# Patient Record
Sex: Female | Born: 1935 | Race: White | Hispanic: No | Marital: Married | Smoking: Never smoker
Health system: Southern US, Community
[De-identification: ages and names within clinical notes are randomized; demographics above are authoritative.]

## PROBLEM LIST (undated history)

## (undated) DIAGNOSIS — I10 Essential (primary) hypertension: Secondary | ICD-10-CM

## (undated) DIAGNOSIS — K449 Diaphragmatic hernia without obstruction or gangrene: Secondary | ICD-10-CM

## (undated) DIAGNOSIS — I2510 Atherosclerotic heart disease of native coronary artery without angina pectoris: Secondary | ICD-10-CM

## (undated) DIAGNOSIS — I4891 Unspecified atrial fibrillation: Secondary | ICD-10-CM

## (undated) DIAGNOSIS — I639 Cerebral infarction, unspecified: Secondary | ICD-10-CM

## (undated) DIAGNOSIS — F0390 Unspecified dementia without behavioral disturbance: Secondary | ICD-10-CM

## (undated) HISTORY — PX: CAROTID ENDARTERECTOMY: SUR193

---

## 2011-02-02 ENCOUNTER — Encounter: Payer: Self-pay | Admitting: *Deleted

## 2011-02-02 ENCOUNTER — Emergency Department (HOSPITAL_COMMUNITY)
Admission: EM | Admit: 2011-02-02 | Discharge: 2011-02-02 | Discharge status: Against Medical Advice | Payer: Medicare Other | Attending: Emergency Medicine | Admitting: Emergency Medicine

## 2011-02-02 DIAGNOSIS — M549 Dorsalgia, unspecified: Secondary | ICD-10-CM | POA: Insufficient documentation

## 2011-02-02 HISTORY — DX: Atherosclerotic heart disease of native coronary artery without angina pectoris: I25.10

## 2011-02-02 HISTORY — DX: Unspecified atrial fibrillation: I48.91

## 2011-02-02 HISTORY — DX: Essential (primary) hypertension: I10

## 2011-02-02 HISTORY — DX: Cerebral infarction, unspecified: I63.9

## 2011-02-02 NOTE — ED Notes (Signed)
The pt has been having lower back pain or flank pain since yesterday.  No nor v

## 2013-03-06 ENCOUNTER — Other Ambulatory Visit: Payer: Self-pay | Admitting: Internal Medicine

## 2013-04-16 ENCOUNTER — Other Ambulatory Visit: Payer: Self-pay | Admitting: Internal Medicine

## 2013-06-17 ENCOUNTER — Other Ambulatory Visit: Payer: Self-pay | Admitting: Internal Medicine

## 2013-06-19 NOTE — Telephone Encounter (Signed)
[facility] is not listed PCP.

## 2013-06-19 NOTE — Telephone Encounter (Signed)
Is this a clinic patient? There is no PCP listed and no notes in the chart.

## 2013-06-23 ENCOUNTER — Other Ambulatory Visit: Payer: Self-pay | Admitting: Internal Medicine

## 2013-11-18 ENCOUNTER — Emergency Department (HOSPITAL_COMMUNITY): Payer: Medicare Other

## 2013-11-18 ENCOUNTER — Emergency Department (HOSPITAL_COMMUNITY)
Admission: EM | Admit: 2013-11-18 | Discharge: 2013-11-19 | Disposition: A | Discharge status: Home / Self Care | Payer: Medicare Other | Attending: Emergency Medicine | Admitting: Emergency Medicine

## 2013-11-18 ENCOUNTER — Encounter (HOSPITAL_COMMUNITY): Payer: Self-pay | Admitting: Emergency Medicine

## 2013-11-18 DIAGNOSIS — R0609 Other forms of dyspnea: Secondary | ICD-10-CM | POA: Insufficient documentation

## 2013-11-18 DIAGNOSIS — Z7902 Long term (current) use of antithrombotics/antiplatelets: Secondary | ICD-10-CM | POA: Insufficient documentation

## 2013-11-18 DIAGNOSIS — I4891 Unspecified atrial fibrillation: Secondary | ICD-10-CM | POA: Insufficient documentation

## 2013-11-18 DIAGNOSIS — I2510 Atherosclerotic heart disease of native coronary artery without angina pectoris: Secondary | ICD-10-CM | POA: Diagnosis not present

## 2013-11-18 DIAGNOSIS — R059 Cough, unspecified: Secondary | ICD-10-CM | POA: Insufficient documentation

## 2013-11-18 DIAGNOSIS — R0602 Shortness of breath: Secondary | ICD-10-CM | POA: Diagnosis not present

## 2013-11-18 DIAGNOSIS — F0280 Dementia in other diseases classified elsewhere without behavioral disturbance: Secondary | ICD-10-CM | POA: Diagnosis not present

## 2013-11-18 DIAGNOSIS — R05 Cough: Secondary | ICD-10-CM | POA: Insufficient documentation

## 2013-11-18 DIAGNOSIS — G309 Alzheimer's disease, unspecified: Secondary | ICD-10-CM | POA: Insufficient documentation

## 2013-11-18 DIAGNOSIS — I10 Essential (primary) hypertension: Secondary | ICD-10-CM | POA: Diagnosis not present

## 2013-11-18 DIAGNOSIS — Z8673 Personal history of transient ischemic attack (TIA), and cerebral infarction without residual deficits: Secondary | ICD-10-CM | POA: Diagnosis not present

## 2013-11-18 DIAGNOSIS — R0989 Other specified symptoms and signs involving the circulatory and respiratory systems: Secondary | ICD-10-CM | POA: Insufficient documentation

## 2013-11-18 DIAGNOSIS — R0600 Dyspnea, unspecified: Secondary | ICD-10-CM

## 2013-11-18 DIAGNOSIS — Z79899 Other long term (current) drug therapy: Secondary | ICD-10-CM | POA: Diagnosis not present

## 2013-11-18 LAB — CBC WITH DIFFERENTIAL/PLATELET
BASOS ABS: 0.1 10*3/uL (ref 0.0–0.1)
BASOS PCT: 0 % (ref 0–1)
EOS PCT: 2 % (ref 0–5)
Eosinophils Absolute: 0.3 10*3/uL (ref 0.0–0.7)
HCT: 38.4 % (ref 36.0–46.0)
Hemoglobin: 12.9 g/dL (ref 12.0–15.0)
LYMPHS PCT: 21 % (ref 12–46)
Lymphs Abs: 3 10*3/uL (ref 0.7–4.0)
MCH: 31 pg (ref 26.0–34.0)
MCHC: 33.6 g/dL (ref 30.0–36.0)
MCV: 92.3 fL (ref 78.0–100.0)
MONO ABS: 1.2 10*3/uL — AB (ref 0.1–1.0)
Monocytes Relative: 8 % (ref 3–12)
NEUTROS ABS: 10 10*3/uL — AB (ref 1.7–7.7)
Neutrophils Relative %: 69 % (ref 43–77)
PLATELETS: 245 10*3/uL (ref 150–400)
RBC: 4.16 MIL/uL (ref 3.87–5.11)
RDW: 14.4 % (ref 11.5–15.5)
WBC: 14.6 10*3/uL — AB (ref 4.0–10.5)

## 2013-11-18 LAB — BASIC METABOLIC PANEL
ANION GAP: 14 (ref 5–15)
BUN: 35 mg/dL — ABNORMAL HIGH (ref 6–23)
CALCIUM: 9.1 mg/dL (ref 8.4–10.5)
CO2: 24 meq/L (ref 19–32)
CREATININE: 1.34 mg/dL — AB (ref 0.50–1.10)
Chloride: 98 mEq/L (ref 96–112)
GFR calc non Af Amer: 35 mL/min — ABNORMAL LOW (ref >90)
GFR, EST AFRICAN AMERICAN: 41 mL/min — AB (ref >90)
Glucose, Bld: 105 mg/dL — ABNORMAL HIGH (ref 70–99)
Potassium: 3.7 mEq/L (ref 3.7–5.3)
SODIUM: 136 meq/L — AB (ref 137–147)

## 2013-11-18 LAB — I-STAT TROPONIN, ED: Troponin i, poc: 0.01 ng/mL (ref 0.00–0.08)

## 2013-11-18 LAB — PRO B NATRIURETIC PEPTIDE: PRO B NATRI PEPTIDE: 1073 pg/mL — AB (ref 0–450)

## 2013-11-18 MED ORDER — IPRATROPIUM-ALBUTEROL 0.5-2.5 (3) MG/3ML IN SOLN
3.0000 mL | Freq: Once | RESPIRATORY_TRACT | Status: AC
  Administered 2013-11-18: 3 mL via RESPIRATORY_TRACT
  Filled 2013-11-18: qty 3

## 2013-11-18 NOTE — ED Notes (Addendum)
The patient's daughter said her mother has not been feeling well for a couple of days.  I asked the patient if her throat is sore asnd she said no.  The daughter advised me her mother has alzheimers and doesn't remember tellling her.  The patient has been afebrile, the cough is non-productive, and the halls the daughter gave her helped her throat. The patient denies any other symptoms.   The daughter says she has has dyspnea with exertion and this is not normal for her but she thinks it is due to her sinus congestion.

## 2013-11-18 NOTE — Discharge Instructions (Signed)
Cough, Adult  A cough is a reflex that helps clear your throat and airways. It can help heal the body or may be a reaction to an irritated airway. A cough may only last 2 or 3 weeks (acute) or may last more than 8 weeks (chronic).  CAUSES Acute cough:  Viral or bacterial infections. Chronic cough:  Infections.  Allergies.  Asthma.  Post-nasal drip.  Smoking.  Heartburn or acid reflux.  Some medicines.  Chronic lung problems (COPD).  Cancer. SYMPTOMS   Cough.  Fever.  Chest pain.  Increased breathing rate.  High-pitched whistling sound when breathing (wheezing).  Colored mucus that you cough up (sputum). TREATMENT   A bacterial cough may be treated with antibiotic medicine.  A viral cough must run its course and will not respond to antibiotics.  Your caregiver may recommend other treatments if you have a chronic cough. HOME CARE INSTRUCTIONS   Only take over-the-counter or prescription medicines for pain, discomfort, or fever as directed by your caregiver. Use cough suppressants only as directed by your caregiver.  Use a cold steam vaporizer or humidifier in your bedroom or home to help loosen secretions.  Sleep in a semi-upright position if your cough is worse at night.  Rest as needed.  Stop smoking if you smoke. SEEK IMMEDIATE MEDICAL CARE IF:   You have pus in your sputum.  Your cough starts to worsen.  You cannot control your cough with suppressants and are losing sleep.  You begin coughing up blood.  You have difficulty breathing.  You develop pain which is getting worse or is uncontrolled with medicine.  You have a fever. MAKE SURE YOU:   Understand these instructions.  Will watch your condition.  Will get help right away if you are not doing well or get worse. Document Released: 08/22/2010 Document Revised: 05/18/2011 Document Reviewed: 08/22/2010 [employer] Patient Information 2015 [employer]. This information is not intended  to replace advice given to you by your health care provider. Make sure you discuss any questions you have with your health care provider.  

## 2013-11-18 NOTE — ED Provider Notes (Signed)
CSN: [id number]     Arrival date & time 11/18/13  1927 History   First MD Initiated Contact with Patient 11/18/13 2130     Chief Complaint  Patient presents with  . Cough    The patient's daughter said her mother has not been feeling well for a couple of days.  I asked the patient if her throat is sore asnd she said no.  The daughter advised me her mother has alzheimers and doesn't remember tellling her.  . Sore Throat     (Consider location/radiation/quality/duration/timing/severity/associated sxs/prior Treatment) Patient is a 80 y.o. female presenting with cough.  Cough Cough characteristics:  Non-productive Severity:  Moderate Onset quality:  Gradual Duration:  2 days Timing:  Constant Progression:  Unchanged Chronicity:  New Context: not sick contacts and not upper respiratory infection   Relieved by:  Nothing Worsened by:  Nothing tried Ineffective treatments:  None tried Associated symptoms: shortness of breath   Associated symptoms: no chest pain, no chills, no fever, no rash, no rhinorrhea and no sore throat     Past Medical History  Diagnosis Date  . Stroke   . Coronary artery disease   . Atrial fibrillation   . Hypertension    History reviewed. No pertinent past surgical history. History reviewed. No pertinent family history. History  Substance Use Topics  . Smoking status: Never Smoker   . Smokeless tobacco: Never Used  . Alcohol Use: No   OB History   Grav Para Term Preterm Abortions TAB SAB Ect Mult Living                 Review of Systems  Constitutional: Negative for fever and chills.  HENT: Negative for congestion, rhinorrhea and sore throat.   Eyes: Negative for photophobia and visual disturbance.  Respiratory: Positive for cough and shortness of breath.   Cardiovascular: Negative for chest pain and leg swelling.  Gastrointestinal: Negative for nausea, vomiting, abdominal pain, diarrhea and constipation.  Endocrine: Negative for polyphagia and  polyuria.  Genitourinary: Negative for dysuria, flank pain, vaginal bleeding, vaginal discharge and enuresis.  Musculoskeletal: Negative for back pain and gait problem.  Skin: Negative for color change and rash.  Neurological: Negative for dizziness, syncope, light-headedness and numbness.  Hematological: Negative for adenopathy. Does not bruise/bleed easily.  All other systems reviewed and are negative.     Allergies  Review of patient's allergies indicates no known allergies.  Home Medications   Prior to Admission medications   Medication Sig Start Date End Date Taking? Authorizing Provider  dabigatran (PRADAXA) 75 MG CAPS Take 75 mg by mouth every 12 (twelve) hours.      Historical Provider, MD  niacin 100 MG tablet Take 100 mg by mouth daily with breakfast.      Historical Provider, MD  simvastatin (ZOCOR) 10 MG tablet Take 10 mg by mouth at bedtime.      Historical Provider, MD  telmisartan (MICARDIS) 40 MG tablet Take 40 mg by mouth daily.      Historical Provider, MD  verapamil (CALAN-SR) 120 MG CR tablet Take 240 mg by mouth at bedtime.      Historical Provider, MD   BP 140/56  Pulse 84  Temp(Src) 97.5 F (36.4 C) (Oral)  Resp 17  SpO2 94% Physical Exam  Vitals reviewed. Constitutional: She is oriented to person, place, and time. She appears well-developed and well-nourished.  HENT:  Head: Normocephalic and atraumatic.  Right Ear: External ear normal.  Left Ear: External ear normal.  Mouth/Throat: Oropharynx is clear and moist. No posterior oropharyngeal edema or posterior oropharyngeal erythema.  Eyes: Conjunctivae and EOM are normal. Pupils are equal, round, and reactive to light.  Neck: Normal range of motion. Neck supple.  Cardiovascular: Normal rate, regular rhythm, normal heart sounds and intact distal pulses.   Pulmonary/Chest: Effort normal and breath sounds normal.  Abdominal: Soft. Bowel sounds are normal. There is no tenderness.  Musculoskeletal: Normal  range of motion.  Neurological: She is alert and oriented to person, place, and time.  Skin: Skin is warm and dry.    ED Course  Procedures (including critical care time) Labs Review Labs Reviewed  CBC WITH DIFFERENTIAL - Abnormal; Notable for the following:    WBC 14.6 (*)    Neutro Abs 10.0 (*)    Monocytes Absolute 1.2 (*)    All other components within normal limits  BASIC METABOLIC PANEL - Abnormal; Notable for the following:    Sodium 136 (*)    Glucose, Bld 105 (*)    BUN 35 (*)    Creatinine, Ser 1.34 (*)    GFR calc non Af Amer 35 (*)    GFR calc Af Amer 41 (*)    All other components within normal limits  PRO B NATRIURETIC PEPTIDE - Abnormal; Notable for the following:    Pro B Natriuretic peptide (BNP) 1073.0 (*)    All other components within normal limits  I-STAT TROPOININ, ED    Imaging Review Dg Chest 2 View (if Patient Has Fever And/or Copd)  11/18/2013   CLINICAL DATA:  Sore throat, cough and shortness of Breath.  EXAM: CHEST  2 VIEW  COMPARISON:  09/12/2013.  FINDINGS: The heart is enlarged but stable. There is tortuosity and calcification of the thoracic aorta. There is a large hiatal hernia with compressive atelectasis of the left lower lobe. No infiltrates or effusions. No definite pleural effusion. The bony thorax is intact and stable.  IMPRESSION: Large hiatal hernia, unchanged.  No acute pulmonary findings.   Electronically Signed   By: Loralie Champagne M.D.   On: 11/18/2013 20:44     EKG Interpretation   Date/Time:  Saturday November 18 2013 21:39:14 EDT Ventricular Rate:  80 PR Interval:    QRS Duration: 83 QT Interval:  430 QTC Calculation: 496 R Axis:   80 Text Interpretation:  Atrial fibrillation Borderline prolonged QT interval  No old tracing to compare Confirmed by Mirian Mo [phone]) on  11/18/2013 9:46:02 PM      MDM   Final diagnoses:  Cough  Dyspnea    80 y.o. female  with pertinent PMH of alzheimers, CAD, afib, CVA  presents with cough and sore throat x2 days. No fevers, GI symptoms. The patient has had a nonproductive cough and complains of a sore throat. On arrival to ED vital signs and physical exam as above, patient currently not complaining of any symptoms. Physical exam demonstrated clear oropharynx, EKG as above.    Labs and imaging as above reviewed. CXR unremarkable for acute pathology.  Trop negative.  Likely etiology benign dyspnea and cough.  Discussed mildly elevated BNP and pt is to fu with cardiology re: same, however do not suspect ACS or emergent cardiac pathology, as pt had no symptoms and only 1 time cough during stay.  DC home in stable condition.  1. Cough   2. Dyspnea         Mirian Mo, MD 11/18/13 2358

## 2013-11-18 NOTE — ED Notes (Signed)
Dr. Gentry at bedside. 

## 2014-04-15 ENCOUNTER — Inpatient Hospital Stay (HOSPITAL_COMMUNITY)
Admission: EM | Admit: 2014-04-15 | Discharge: 2014-04-18 | DRG: 683 | Payer: Medicare Other | Attending: Internal Medicine | Admitting: Internal Medicine

## 2014-04-15 ENCOUNTER — Inpatient Hospital Stay (HOSPITAL_COMMUNITY): Payer: Medicare Other

## 2014-04-15 ENCOUNTER — Encounter (HOSPITAL_COMMUNITY): Payer: Self-pay | Admitting: Internal Medicine

## 2014-04-15 ENCOUNTER — Emergency Department (HOSPITAL_COMMUNITY): Payer: Medicare Other

## 2014-04-15 DIAGNOSIS — I129 Hypertensive chronic kidney disease with stage 1 through stage 4 chronic kidney disease, or unspecified chronic kidney disease: Secondary | ICD-10-CM | POA: Diagnosis present

## 2014-04-15 DIAGNOSIS — Z7901 Long term (current) use of anticoagulants: Secondary | ICD-10-CM | POA: Diagnosis not present

## 2014-04-15 DIAGNOSIS — K5900 Constipation, unspecified: Secondary | ICD-10-CM | POA: Diagnosis present

## 2014-04-15 DIAGNOSIS — G309 Alzheimer's disease, unspecified: Secondary | ICD-10-CM | POA: Diagnosis present

## 2014-04-15 DIAGNOSIS — N183 Chronic kidney disease, stage 3 unspecified: Secondary | ICD-10-CM | POA: Diagnosis present

## 2014-04-15 DIAGNOSIS — R339 Retention of urine, unspecified: Secondary | ICD-10-CM | POA: Diagnosis present

## 2014-04-15 DIAGNOSIS — F0280 Dementia in other diseases classified elsewhere without behavioral disturbance: Secondary | ICD-10-CM | POA: Diagnosis present

## 2014-04-15 DIAGNOSIS — I2510 Atherosclerotic heart disease of native coronary artery without angina pectoris: Secondary | ICD-10-CM | POA: Diagnosis present

## 2014-04-15 DIAGNOSIS — Z9981 Dependence on supplemental oxygen: Secondary | ICD-10-CM

## 2014-04-15 DIAGNOSIS — N179 Acute kidney failure, unspecified: Secondary | ICD-10-CM | POA: Diagnosis present

## 2014-04-15 DIAGNOSIS — I4891 Unspecified atrial fibrillation: Secondary | ICD-10-CM | POA: Diagnosis present

## 2014-04-15 DIAGNOSIS — Z8673 Personal history of transient ischemic attack (TIA), and cerebral infarction without residual deficits: Secondary | ICD-10-CM | POA: Diagnosis not present

## 2014-04-15 DIAGNOSIS — R414 Neurologic neglect syndrome: Secondary | ICD-10-CM | POA: Diagnosis present

## 2014-04-15 DIAGNOSIS — J189 Pneumonia, unspecified organism: Secondary | ICD-10-CM | POA: Diagnosis present

## 2014-04-15 DIAGNOSIS — F0390 Unspecified dementia without behavioral disturbance: Secondary | ICD-10-CM | POA: Diagnosis present

## 2014-04-15 LAB — BASIC METABOLIC PANEL
Anion gap: 10 (ref 5–15)
BUN: 34 mg/dL — ABNORMAL HIGH (ref 6–23)
CO2: 28 mmol/L (ref 19–32)
Calcium: 9.1 mg/dL (ref 8.4–10.5)
Chloride: 104 mmol/L (ref 96–112)
Creatinine, Ser: 1.75 mg/dL — ABNORMAL HIGH (ref 0.50–1.10)
GFR calc Af Amer: 29 mL/min — ABNORMAL LOW (ref >90)
GFR calc non Af Amer: 25 mL/min — ABNORMAL LOW (ref >90)
Glucose, Bld: 148 mg/dL — ABNORMAL HIGH (ref 70–99)
Potassium: 4.6 mmol/L (ref 3.5–5.1)
Sodium: 142 mmol/L (ref 135–145)

## 2014-04-15 LAB — CBC WITH DIFFERENTIAL/PLATELET
BASOS ABS: 0.1 10*3/uL (ref 0.0–0.1)
Basophils Relative: 1 % (ref 0–1)
EOS ABS: 0.5 10*3/uL (ref 0.0–0.7)
EOS PCT: 4 % (ref 0–5)
HEMATOCRIT: 40.9 % (ref 36.0–46.0)
Hemoglobin: 13 g/dL (ref 12.0–15.0)
LYMPHS ABS: 2.5 10*3/uL (ref 0.7–4.0)
Lymphocytes Relative: 17 % (ref 12–46)
MCH: 31.2 pg (ref 26.0–34.0)
MCHC: 31.8 g/dL (ref 30.0–36.0)
MCV: 98.1 fL (ref 78.0–100.0)
Monocytes Absolute: 1 10*3/uL (ref 0.1–1.0)
Monocytes Relative: 7 % (ref 3–12)
NEUTROS PCT: 71 % (ref 43–77)
Neutro Abs: 10.4 10*3/uL — ABNORMAL HIGH (ref 1.7–7.7)
PLATELETS: 207 10*3/uL (ref 150–400)
RBC: 4.17 MIL/uL (ref 3.87–5.11)
RDW: 14.6 % (ref 11.5–15.5)
WBC: 14.5 10*3/uL — AB (ref 4.0–10.5)

## 2014-04-15 LAB — URINALYSIS, ROUTINE W REFLEX MICROSCOPIC
Bilirubin Urine: NEGATIVE
GLUCOSE, UA: NEGATIVE mg/dL
Hgb urine dipstick: NEGATIVE
Ketones, ur: NEGATIVE mg/dL
Leukocytes, UA: NEGATIVE
NITRITE: NEGATIVE
Protein, ur: NEGATIVE mg/dL
SPECIFIC GRAVITY, URINE: 1.014 (ref 1.005–1.030)
Urobilinogen, UA: 0.2 mg/dL (ref 0.0–1.0)
pH: 7.5 (ref 5.0–8.0)

## 2014-04-15 MED ORDER — SIMVASTATIN 10 MG PO TABS
10.0000 mg | ORAL_TABLET | Freq: Every day | ORAL | Status: DC
  Administered 2014-04-15 – 2014-04-16 (×2): 10 mg via ORAL
  Filled 2014-04-15 (×4): qty 1

## 2014-04-15 MED ORDER — METOPROLOL SUCCINATE ER 25 MG PO TB24
25.0000 mg | ORAL_TABLET | Freq: Every day | ORAL | Status: DC
  Administered 2014-04-16 – 2014-04-18 (×3): 25 mg via ORAL
  Filled 2014-04-15 (×3): qty 1

## 2014-04-15 MED ORDER — ENOXAPARIN SODIUM 30 MG/0.3ML ~~LOC~~ SOLN: 30.0000 mg | SUBCUTANEOUS | Status: DC

## 2014-04-15 MED ORDER — PNEUMOCOCCAL VAC POLYVALENT 25 MCG/0.5ML IJ INJ
0.5000 mL | INJECTION | INTRAMUSCULAR | Status: AC
  Administered 2014-04-16: 0.5 mL via INTRAMUSCULAR
  Filled 2014-04-15 (×2): qty 0.5

## 2014-04-15 MED ORDER — SENNOSIDES-DOCUSATE SODIUM 8.6-50 MG PO TABS
1.0000 | ORAL_TABLET | Freq: Every day | ORAL | Status: DC
  Administered 2014-04-15 – 2014-04-16 (×2): 1 via ORAL
  Filled 2014-04-15 (×2): qty 1

## 2014-04-15 MED ORDER — POLYETHYLENE GLYCOL 3350 17 G PO PACK
17.0000 g | PACK | Freq: Two times a day (BID) | ORAL | Status: DC
  Administered 2014-04-15 – 2014-04-18 (×3): 17 g via ORAL
  Filled 2014-04-15 (×6): qty 1

## 2014-04-15 MED ORDER — DEXTROSE 5 % IV SOLN: 1.0000 g | INTRAVENOUS | Status: DC

## 2014-04-15 MED ORDER — FLEET ENEMA 7-19 GM/118ML RE ENEM: 1.0000 | ENEMA | Freq: Every day | RECTAL | Status: DC | PRN

## 2014-04-15 MED ORDER — CEFTRIAXONE SODIUM IN DEXTROSE 20 MG/ML IV SOLN
1.0000 g | INTRAVENOUS | Status: DC
  Administered 2014-04-16 – 2014-04-17 (×2): 1 g via INTRAVENOUS
  Filled 2014-04-15 (×3): qty 50

## 2014-04-15 MED ORDER — DOCUSATE SODIUM 100 MG PO CAPS
100.0000 mg | ORAL_CAPSULE | Freq: Two times a day (BID) | ORAL | Status: DC
  Administered 2014-04-15 – 2014-04-18 (×4): 100 mg via ORAL
  Filled 2014-04-15 (×7): qty 1

## 2014-04-15 MED ORDER — NIACIN 100 MG PO TABS
100.0000 mg | ORAL_TABLET | Freq: Every day | ORAL | Status: DC
  Administered 2014-04-16 – 2014-04-18 (×3): 100 mg via ORAL
  Filled 2014-04-15 (×3): qty 1

## 2014-04-15 MED ORDER — DABIGATRAN ETEXILATE MESYLATE 75 MG PO CAPS
75.0000 mg | ORAL_CAPSULE | Freq: Two times a day (BID) | ORAL | Status: DC
  Administered 2014-04-16 – 2014-04-18 (×4): 75 mg via ORAL
  Filled 2014-04-15 (×6): qty 1

## 2014-04-15 MED ORDER — SODIUM CHLORIDE 0.9 % IV SOLN
INTRAVENOUS | Status: AC
  Administered 2014-04-15: 21:00:00 via INTRAVENOUS

## 2014-04-15 MED ORDER — FLEET ENEMA 7-19 GM/118ML RE ENEM
1.0000 | ENEMA | Freq: Once | RECTAL | Status: AC
  Administered 2014-04-15: 1 via RECTAL
  Filled 2014-04-15: qty 1

## 2014-04-15 MED ORDER — DARIFENACIN HYDROBROMIDE ER 15 MG PO TB24
15.0000 mg | ORAL_TABLET | Freq: Every day | ORAL | Status: DC
  Administered 2014-04-16 – 2014-04-18 (×3): 15 mg via ORAL
  Filled 2014-04-15 (×3): qty 1

## 2014-04-15 MED ORDER — DEXTROSE 5 % IV SOLN
500.0000 mg | Freq: Every day | INTRAVENOUS | Status: DC
  Administered 2014-04-16 – 2014-04-17 (×2): 500 mg via INTRAVENOUS
  Filled 2014-04-15 (×3): qty 500

## 2014-04-15 MED ORDER — DEXTROSE 5 % IV SOLN
1.0000 g | Freq: Once | INTRAVENOUS | Status: AC
  Administered 2014-04-15: 1 g via INTRAVENOUS
  Filled 2014-04-15: qty 10

## 2014-04-15 MED ORDER — DEXTROSE 5 % IV SOLN: 500.0000 mg | INTRAVENOUS | Status: DC

## 2014-04-15 MED ORDER — DEXTROSE 5 % IV SOLN
500.0000 mg | Freq: Once | INTRAVENOUS | Status: AC
  Administered 2014-04-15: 500 mg via INTRAVENOUS
  Filled 2014-04-15: qty 500

## 2014-04-15 MED ORDER — MEMANTINE HCL ER 28 MG PO CP24
28.0000 mg | ORAL_CAPSULE | Freq: Every day | ORAL | Status: DC
  Administered 2014-04-16 – 2014-04-18 (×3): 28 mg via ORAL
  Filled 2014-04-15 (×3): qty 1

## 2014-04-15 MED ORDER — BISACODYL 10 MG RE SUPP: 10.0000 mg | Freq: Every day | RECTAL | Status: DC | PRN

## 2014-04-15 NOTE — ED Notes (Signed)
Pt family reports pt has not had bowel movement in 2 weeks. Went to urgent care last Monday was given a laxative, went to ED on Wednesday was given another laxative and dx with UTI and started on abx. Went to pcp on Friday and was told to give fleets enema and other enema. Pt still has not had bowel movement. Pt told to come to ED. Pain 8/10. Pt has not eaten in 2 days.

## 2014-04-15 NOTE — ED Notes (Signed)
Bladder scan showed 600 ml in bladder

## 2014-04-15 NOTE — ED Notes (Signed)
MD at bedside. 

## 2014-04-15 NOTE — ED Provider Notes (Signed)
CSN: [id number]     Arrival date & time 04/15/14  1606 History   First MD Initiated Contact with Patient 04/15/14 1713     Chief Complaint  Patient presents with  . Constipation     (Consider location/radiation/quality/duration/timing/severity/associated sxs/prior Treatment) HPI Comments: Patient with past medical history remarkable for Alzheimer's, stroke, CAD, A. fib, and hypertension presents to the emergency department with chief complaint of constipation. Patient's daughter states patient has not had a bowel movement in 2 weeks. She has tried laxatives, and fleets enemas. Daughter also reports urinary hesitancy. She states that she was recently diagnosed with a UTI.  Level V caveat applies secondary to dementia.  Daughter also reports that the patient has felt short of breath and has had a cough.  Lives at home.  No recent hospitalizations.  The history is provided by the patient. No language interpreter was used.    Past Medical History  Diagnosis Date  . Stroke   . Coronary artery disease   . Atrial fibrillation   . Hypertension    No past surgical history on file. No family history on file. History  Substance Use Topics  . Smoking status: Never Smoker   . Smokeless tobacco: Never Used  . Alcohol Use: No   OB History    No data available     Review of Systems  Constitutional: Negative for fever and chills.  Respiratory: Negative for shortness of breath.   Cardiovascular: Negative for chest pain.  Gastrointestinal: Positive for constipation. Negative for nausea, vomiting and diarrhea.  Genitourinary: Positive for difficulty urinating. Negative for dysuria.  All other systems reviewed and are negative.     Allergies  Review of patient's allergies indicates no known allergies.  Home Medications   Prior to Admission medications   Medication Sig Start Date End Date Taking? Authorizing Provider  dabigatran (PRADAXA) 75 MG CAPS Take 75 mg by mouth every 12  (twelve) hours.      Historical Provider, MD  niacin 100 MG tablet Take 100 mg by mouth daily with breakfast.      Historical Provider, MD  simvastatin (ZOCOR) 10 MG tablet Take 10 mg by mouth at bedtime.      Historical Provider, MD  telmisartan (MICARDIS) 40 MG tablet Take 40 mg by mouth daily.      Historical Provider, MD  verapamil (CALAN-SR) 120 MG CR tablet Take 240 mg by mouth at bedtime.      Historical Provider, MD   BP 127/79 mmHg  Pulse 88  Temp(Src) 98.1 F (36.7 C) (Oral)  Resp 20  SpO2 95% Physical Exam  Constitutional: She is oriented to person, place, and time. She appears well-developed and well-nourished.  HENT:  Head: Normocephalic and atraumatic.  Eyes: Conjunctivae and EOM are normal. Pupils are equal, round, and reactive to light.  Neck: Normal range of motion. Neck supple.  Cardiovascular: Normal rate and regular rhythm.  Exam reveals no gallop and no friction rub.   No murmur heard. Pulmonary/Chest: Effort normal and breath sounds normal. No respiratory distress. She has no wheezes. She has no rales. She exhibits no tenderness.  Abdominal: Soft. Bowel sounds are normal. She exhibits distension. She exhibits no mass. There is no tenderness. There is no rebound and no guarding.  Abdomen moderately distended, but no tenderness to palpation  Musculoskeletal: Normal range of motion. She exhibits no edema or tenderness.  Neurological: She is alert and oriented to person, place, and time.  Skin: Skin is warm and dry.  Psychiatric: She has a normal mood and affect. Her behavior is normal. Judgment and thought content normal.  Nursing note and vitals reviewed.   ED Course  Fecal disimpaction Date/Time: 04/15/2014 7:23 PM Performed by: Roxy HorsemanBROWNING, Chaynce Schafer Authorized by: Roxy HorsemanBROWNING, Leonid Manus Consent: Verbal consent obtained. Risks and benefits: risks, benefits and alternatives were discussed Consent given by: patient Patient understanding: patient states understanding of the  procedure being performed Patient consent: the patient's understanding of the procedure matches consent given Procedure consent: procedure consent matches procedure scheduled Relevant documents: relevant documents present and verified Test results: test results available and properly labeled Site marked: the operative site was marked Imaging studies: imaging studies available Required items: required blood products, implants, devices, and special equipment available Patient identity confirmed: verbally with patient Preparation: Patient was prepped and draped in the usual sterile fashion. Local anesthesia used: no Patient sedated: no Patient tolerance: Patient tolerated the procedure well with no immediate complications Comments: Large amount of stool removed by me, however, large very firm stool ball remains out of reach   Results for orders placed or performed during the hospital encounter of 04/15/14  Urinalysis, Routine w reflex microscopic  Result Value Ref Range   Color, Urine YELLOW YELLOW   APPearance CLEAR CLEAR   Specific Gravity, Urine 1.014 1.005 - 1.030   pH 7.5 5.0 - 8.0   Glucose, UA NEGATIVE NEGATIVE mg/dL   Hgb urine dipstick NEGATIVE NEGATIVE   Bilirubin Urine NEGATIVE NEGATIVE   Ketones, ur NEGATIVE NEGATIVE mg/dL   Protein, ur NEGATIVE NEGATIVE mg/dL   Urobilinogen, UA 0.2 0.0 - 1.0 mg/dL   Nitrite NEGATIVE NEGATIVE   Leukocytes, UA NEGATIVE NEGATIVE  CBC with Differential/Platelet  Result Value Ref Range   WBC 14.5 (H) 4.0 - 10.5 K/uL   RBC 4.17 3.87 - 5.11 MIL/uL   Hemoglobin 13.0 12.0 - 15.0 g/dL   HCT 09.840.9 11.936.0 - 14.746.0 %   MCV 98.1 78.0 - 100.0 fL   MCH 31.2 26.0 - 34.0 pg   MCHC 31.8 30.0 - 36.0 g/dL   RDW 82.914.6 56.211.5 - 13.015.5 %   Platelets 207 150 - 400 K/uL   Neutrophils Relative % 71 43 - 77 %   Neutro Abs 10.4 (H) 1.7 - 7.7 K/uL   Lymphocytes Relative 17 12 - 46 %   Lymphs Abs 2.5 0.7 - 4.0 K/uL   Monocytes Relative 7 3 - 12 %   Monocytes Absolute  1.0 0.1 - 1.0 K/uL   Eosinophils Relative 4 0 - 5 %   Eosinophils Absolute 0.5 0.0 - 0.7 K/uL   Basophils Relative 1 0 - 1 %   Basophils Absolute 0.1 0.0 - 0.1 K/uL  Basic metabolic panel  Result Value Ref Range   Sodium 142 135 - 145 mmol/L   Potassium 4.6 3.5 - 5.1 mmol/L   Chloride 104 96 - 112 mmol/L   CO2 28 19 - 32 mmol/L   Glucose, Bld 148 (H) 70 - 99 mg/dL   BUN 34 (H) 6 - 23 mg/dL   Creatinine, Ser 8.651.75 (H) 0.50 - 1.10 mg/dL   Calcium 9.1 8.4 - 78.410.5 mg/dL   GFR calc non Af Amer 25 (L) >90 mL/min   GFR calc Af Amer 29 (L) >90 mL/min   Anion gap 10 5 - 15   Dg Abd Acute W/chest  04/15/2014   CLINICAL DATA:  Abdominal distention.  No bowel movement in 2 weeks.  EXAM: ACUTE ABDOMEN SERIES (ABDOMEN 2 VIEW & CHEST 1  VIEW)  COMPARISON:  04/03/2014  FINDINGS: Cardiac enlargement. Pulmonary vascularity appears normal. Scattered fibrosis in the lungs. Increased density in the right upper lung suggests infiltration or atelectasis. Pneumonia should be excluded. No blunting of costophrenic angles. No pneumothorax. Calcified and tortuous aorta. Surgical clips in the right axilla. Large esophageal hiatal hernia behind the heart.  Diffusely stool-filled colon with scattered gas in the colon and small bowel. No small or large bowel distention. No free intra-abdominal air. No abnormal air-fluid levels. No radiopaque stones identified. Vascular calcifications. Degenerative changes in the spine and hips.  IMPRESSION: Cardiac enlargement without vascular congestion. Infiltration or atelectasis in the right upper lung possibly due to pneumonia. Diffusely stool-filled colon consistent with clinical history of constipation. No evidence of bowel obstruction or free air.   Electronically Signed   By: Burman Nieves M.D.   On: 04/15/2014 18:03     Imaging Review No results found.   EKG Interpretation None      MDM   Final diagnoses:  Constipation, unspecified constipation type  Urinary retention   AKI (acute kidney injury)  CAP (community acquired pneumonia)    Patient with reported constipation 2 weeks. No notable bowel movements. Has tried laxatives and enemas with no relief. Also recently diagnosed with a UTI.  Patient retaining of urine, likely 2/2 stool burden.  Some associated AKI.  Cr 1.75.  CXR remarkable for possible pneumonia; this is consistent with patient's cough and SOB.  7:25 PM Large amount of stool removed by me, however, a large very firm stool ball remains out of reach.  Patient discussed with Dr. Donnald Garre who recommends admission.  Will consult [facility].  Appreciate Dr. Adela Glimpse for admission.    Roxy Horseman, PA-C 04/15/14 2010  Arby Barrette, MD 04/17/14 [phone]

## 2014-04-15 NOTE — H&P (Signed)
PCP: Zoila Shutter, MD    Chief Complaint:  No BM for 2 weeks   HPI: Carolyn Sanders is a 81 y.o. female   has a past medical history of Stroke; Coronary artery disease; Atrial fibrillation; and Hypertension.   Presented with  Family states patient have not had a BM 2 weeks. She was seen at Urgent care 1 week ago and was treated for UTI. She was told that she is constipated and was treated with miralax and fleet enema with no results. She has been short of breath and have had some cough. She is on home oxygen 2 L at night. In ER WBC 14.5. CXR showing CA and large amount of stool patient was disimpacted. Per family she have not had good urine output for the past 4  Days. A foley was placed and urine was obtained. 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  Hospitalist was called for admission for obstipation, CP, urinary retention.   Review of Systems:    Pertinent positives include fatigue, non-productive cough  Constitutional:  No weight loss, night sweats, Fevers, chills,  weight loss  HEENT:  No headaches, Difficulty swallowing,Tooth/dental problems,Sore throat,  No sneezing, itching, ear ache, nasal congestion, post nasal drip,  Cardio-vascular:  No chest pain, Orthopnea, PND, anasarca, dizziness, palpitations.no Bilateral lower extremity swelling  GI:  No heartburn, indigestion, abdominal pain, nausea, vomiting, diarrhea, change in bowel habits, loss of appetite, melena, blood in stool, hematemesis Resp:  no shortness of breath at rest. No dyspnea on exertion, No excess mucus, no productive cough, No , No coughing up of blood.No change in color of mucus.No wheezing. Skin:  no rash or lesions. No jaundice GU:  no dysuria, change in color of urine, no urgency or frequency. No straining  to urinate.  No flank pain.  Musculoskeletal:  No joint pain or no joint swelling. No decreased range of motion. No back pain.  Psych:  No change in mood or affect. No depression or anxiety. No memory loss.  Neuro: no localizing neurological complaints, no tingling, no weakness, no double vision, no gait abnormality, no slurred speech, no confusion  Otherwise ROS are negative except for above, 10 systems were reviewed  Past Medical History: Past Medical History  Diagnosis Date  . Stroke   . Coronary artery disease   . Atrial fibrillation   . Hypertension    History reviewed. No pertinent past surgical history.   Medications: Prior to Admission medications   Medication Sig Start Date End Date Taking? Authorizing Provider  dabigatran (PRADAXA) 75 MG CAPS Take 75 mg by mouth every 12 (twelve) hours.     Yes Historical Provider, MD  hydrochlorothiazide (HYDRODIURIL) 25 MG tablet Take 25 mg by mouth daily.   Yes Historical Provider, MD  memantine (NAMENDA XR) 28 MG CP24 24 hr capsule Take 28 mg by mouth daily.   Yes Historical Provider, MD  metoprolol succinate (TOPROL-XL) 25 MG 24 hr tablet Take 25 mg by mouth daily.   Yes Historical Provider, MD  niacin 100 MG tablet Take 100 mg by mouth daily with breakfast.     Yes Historical Provider, MD  simvastatin (ZOCOR) 10 MG tablet Take 10 mg by mouth at bedtime.     Yes Historical Provider, MD  solifenacin (VESICARE)  10 MG tablet Take 10 mg by mouth daily.   Yes Historical Provider, MD  telmisartan (MICARDIS) 40 MG tablet Take 40 mg by mouth daily.     Yes Historical Provider, MD    Allergies:  No Known Allergies  Social History:  Ambulatory   independently supposed to use a walker but does not Lives at home  With family     reports that she has never smoked. She has never used smokeless tobacco. She reports that she does not drink alcohol or use illicit drugs.    Family History: family history includes Breast cancer in her  mother.    Physical Exam: Patient Vitals for the past 24 hrs:  BP Temp Temp src Pulse Resp SpO2  04/15/14 1942 141/56 mmHg 97.7 F (36.5 C) Oral 87 18 91 %  04/15/14 1613 127/79 mmHg 98.1 F (36.7 C) Oral 88 20 95 %    1. General:  in No Acute distress 2. Psychological: Alert and  Oriented 3. Head/ENT:   Dry Mucous Membranes                          Head Non traumatic, neck supple                          Poor Dentition 4. SKIN:   decreased Skin turgor,  Skin clean Dry and intact no rash 5. Heart: Regular rate and rhythm no Murmur, Rub or gallop 6. Lungs: Clear to auscultation bilaterally, no wheezes or crackles   7. Abdomen: Soft, non-tender, slightly distended 8. Lower extremities: no clubbing, cyanosis, or edema 9. Neurologically Grossly intact, moving all 4 extremities equally 10. MSK: Normal range of motion  body mass index is unknown because there is no height or weight on file.   Labs on Admission:   Results for orders placed or performed during the hospital encounter of 04/15/14 (from the past 24 hour(s))  CBC with Differential/Platelet     Status: Abnormal   Collection Time: 04/15/14  5:30 PM  Result Value Ref Range   WBC 14.5 (H) 4.0 - 10.5 K/uL   RBC 4.17 3.87 - 5.11 MIL/uL   Hemoglobin 13.0 12.0 - 15.0 g/dL   HCT 16.1 09.6 - 04.5 %   MCV 98.1 78.0 - 100.0 fL   MCH 31.2 26.0 - 34.0 pg   MCHC 31.8 30.0 - 36.0 g/dL   RDW 40.9 81.1 - 91.4 %   Platelets 207 150 - 400 K/uL   Neutrophils Relative % 71 43 - 77 %   Neutro Abs 10.4 (H) 1.7 - 7.7 K/uL   Lymphocytes Relative 17 12 - 46 %   Lymphs Abs 2.5 0.7 - 4.0 K/uL   Monocytes Relative 7 3 - 12 %   Monocytes Absolute 1.0 0.1 - 1.0 K/uL   Eosinophils Relative 4 0 - 5 %   Eosinophils Absolute 0.5 0.0 - 0.7 K/uL   Basophils Relative 1 0 - 1 %   Basophils Absolute 0.1 0.0 - 0.1 K/uL  Basic metabolic panel     Status: Abnormal   Collection Time: 04/15/14  5:30 PM  Result Value Ref Range   Sodium 142 135 - 145  mmol/L   Potassium 4.6 3.5 - 5.1 mmol/L   Chloride 104 96 - 112 mmol/L   CO2 28 19 - 32 mmol/L   Glucose, Bld 148 (H) 70 - 99 mg/dL   BUN 34 (H)  6 - 23 mg/dL   Creatinine, Ser 4.54 (H) 0.50 - 1.10 mg/dL   Calcium 9.1 8.4 - 09.8 mg/dL   GFR calc non Af Amer 25 (L) >90 mL/min   GFR calc Af Amer 29 (L) >90 mL/min   Anion gap 10 5 - 15  Urinalysis, Routine w reflex microscopic     Status: None   Collection Time: 04/15/14  6:24 PM  Result Value Ref Range   Color, Urine YELLOW YELLOW   APPearance CLEAR CLEAR   Specific Gravity, Urine 1.014 1.005 - 1.030   pH 7.5 5.0 - 8.0   Glucose, UA NEGATIVE NEGATIVE mg/dL   Hgb urine dipstick NEGATIVE NEGATIVE   Bilirubin Urine NEGATIVE NEGATIVE   Ketones, ur NEGATIVE NEGATIVE mg/dL   Protein, ur NEGATIVE NEGATIVE mg/dL   Urobilinogen, UA 0.2 0.0 - 1.0 mg/dL   Nitrite NEGATIVE NEGATIVE   Leukocytes, UA NEGATIVE NEGATIVE    UA no evidence of UTI  No results found for: HGBA1C  CrCl cannot be calculated (Unknown ideal weight.).  BNP (last 3 results)  Recent Labs  11/18/13 2157  PROBNP 1073.0*    Other results:  I have pearsonaly reviewed this: ECG not obtained will order    There were no vitals filed for this visit.   Cultures: No results found for: SDES, SPECREQUEST, CULT, REPTSTATUS   Radiological Exams on Admission: Dg Abd Acute W/chest  04/15/2014   CLINICAL DATA:  Abdominal distention.  No bowel movement in 2 weeks.  EXAM: ACUTE ABDOMEN SERIES (ABDOMEN 2 VIEW & CHEST 1 VIEW)  COMPARISON:  04/03/2014  FINDINGS: Cardiac enlargement. Pulmonary vascularity appears normal. Scattered fibrosis in the lungs. Increased density in the right upper lung suggests infiltration or atelectasis. Pneumonia should be excluded. No blunting of costophrenic angles. No pneumothorax. Calcified and tortuous aorta. Surgical clips in the right axilla. Large esophageal hiatal hernia behind the heart.  Diffusely stool-filled colon with scattered gas in the  colon and small bowel. No small or large bowel distention. No free intra-abdominal air. No abnormal air-fluid levels. No radiopaque stones identified. Vascular calcifications. Degenerative changes in the spine and hips.  IMPRESSION: Cardiac enlargement without vascular congestion. Infiltration or atelectasis in the right upper lung possibly due to pneumonia. Diffusely stool-filled colon consistent with clinical history of constipation. No evidence of bowel obstruction or free air.   Electronically Signed   By: Burman Nieves M.D.   On: 04/15/2014 18:03    Chart has been reviewed  Assessment/Plan  81 year old female with history of chronic kidney disease, atrial fibrillation on anticoagulation presents with no bowel movement for the past 2 weeks and poor urine output the past 4 days was found to have severe obstipation with urinary retention.. Acute on chronic renal failure and evidence of community-acquired pneumonia on chest x-ray  Present on Admission:  . Obstipation - status post disimpaction and indeed will continue with bowel regimen repeat KUB in the morning to see if this clearance  . CAP (community acquired pneumonia) -  - will admit for treatment of CAP will start on appropriate antibiotic coverage.  Obtain sputum cultures, blood cultures if febrile or if decompensates.  Provide oxygen as needed.  . Chronic renal disease, stage 3, moderately decreased glomerular filtration rate (GFR) between 30-59 - acute on chronic worsening likely secondary to urinary retention. Foley catheter placed  . Urinary retention -  most likely secondary to obstipation, order renal US . A-fib -chronic continue anticoagulation and did a blocker currently rate controlled  .  Dementia - Chronic continue to monitor and anticipate sundowning while in hospital   Prophylaxis:  Lovenox, Protonix  CODE STATUS:  FULL CODE    Other plan as per orders.  I have spent a total of 55 min on this  admission  Earlena Werst 04/15/2014, 8:13 PM  [facility]  Pager [phone]   after 2 AM please page floor coverage PA If 7AM-7PM, please contact the day team taking care of the patient  [email or web address]  Password [email or web address]

## 2014-04-16 DIAGNOSIS — N179 Acute kidney failure, unspecified: Secondary | ICD-10-CM | POA: Diagnosis not present

## 2014-04-16 DIAGNOSIS — N183 Chronic kidney disease, stage 3 (moderate): Secondary | ICD-10-CM

## 2014-04-16 DIAGNOSIS — J189 Pneumonia, unspecified organism: Secondary | ICD-10-CM

## 2014-04-16 DIAGNOSIS — K5900 Constipation, unspecified: Secondary | ICD-10-CM

## 2014-04-16 LAB — CBC WITH DIFFERENTIAL/PLATELET
Basophils Absolute: 0.1 10*3/uL (ref 0.0–0.1)
Basophils Relative: 1 % (ref 0–1)
EOS PCT: 3 % (ref 0–5)
Eosinophils Absolute: 0.4 10*3/uL (ref 0.0–0.7)
HCT: 37 % (ref 36.0–46.0)
Hemoglobin: 11.5 g/dL — ABNORMAL LOW (ref 12.0–15.0)
Lymphocytes Relative: 8 % — ABNORMAL LOW (ref 12–46)
Lymphs Abs: 1 10*3/uL (ref 0.7–4.0)
MCH: 30.4 pg (ref 26.0–34.0)
MCHC: 31.1 g/dL (ref 30.0–36.0)
MCV: 97.9 fL (ref 78.0–100.0)
MONOS PCT: 9 % (ref 3–12)
Monocytes Absolute: 1.1 10*3/uL — ABNORMAL HIGH (ref 0.1–1.0)
NEUTROS ABS: 9.6 10*3/uL — AB (ref 1.7–7.7)
Neutrophils Relative %: 79 % — ABNORMAL HIGH (ref 43–77)
PLATELETS: 178 10*3/uL (ref 150–400)
RBC: 3.78 MIL/uL — ABNORMAL LOW (ref 3.87–5.11)
RDW: 14.6 % (ref 11.5–15.5)
WBC: 12.2 10*3/uL — ABNORMAL HIGH (ref 4.0–10.5)

## 2014-04-16 LAB — COMPREHENSIVE METABOLIC PANEL
ALK PHOS: 74 U/L (ref 39–117)
ALT: 10 U/L (ref 0–35)
ANION GAP: 8 (ref 5–15)
AST: 17 U/L (ref 0–37)
Albumin: 3.7 g/dL (ref 3.5–5.2)
BILIRUBIN TOTAL: 0.5 mg/dL (ref 0.3–1.2)
BUN: 33 mg/dL — ABNORMAL HIGH (ref 6–23)
CHLORIDE: 101 mmol/L (ref 96–112)
CO2: 29 mmol/L (ref 19–32)
CREATININE: 1.52 mg/dL — AB (ref 0.50–1.10)
Calcium: 8.2 mg/dL — ABNORMAL LOW (ref 8.4–10.5)
GFR calc Af Amer: 35 mL/min — ABNORMAL LOW (ref >90)
GFR calc non Af Amer: 30 mL/min — ABNORMAL LOW (ref >90)
GLUCOSE: 107 mg/dL — AB (ref 70–99)
Potassium: 3.9 mmol/L (ref 3.5–5.1)
Sodium: 138 mmol/L (ref 135–145)
Total Protein: 6.2 g/dL (ref 6.0–8.3)

## 2014-04-16 LAB — STREP PNEUMONIAE URINARY ANTIGEN: Strep Pneumo Urinary Antigen: NEGATIVE

## 2014-04-16 MED ORDER — SORBITOL 70 % SOLN
960.0000 mL | TOPICAL_OIL | Freq: Once | ORAL | Status: AC
  Administered 2014-04-16: 960 mL via RECTAL
  Filled 2014-04-16: qty 240

## 2014-04-16 MED ORDER — PEG 3350-KCL-NA BICARB-NACL 420 G PO SOLR
4000.0000 mL | Freq: Once | ORAL | Status: AC
  Administered 2014-04-16: 4000 mL via ORAL

## 2014-04-16 NOTE — Progress Notes (Addendum)
[facility] PROGRESS NOTE Assessment/Plan: Constipation/Obstipation - Status post disimpaction in the ED, KUB looks unchanged. - Start SMOG enema and golytely. - She is having watery stools. - repeat KUB in am.  Infiltrate versus atelectasis on chest x-ray:-  - She has had no fever she has a mild white count could that could be due to the obstipation. She was started on empiric antibiotics on admission she has no sinus or breath or cough. - We will go ahead and stop her antibiotics and observe her.  Chronic renal disease, stage 3, moderately decreased glomerular filtration rate (GFR) between 30-59 mL/min/1.73 square meter - Creatinine improved with hydration. DC the renal ultrasound. KVO IV fluids check a basic metabolic panel in the morning.  Urinary retention - ? Could be due to constipation. Had about 1.5 L when  Foley was inserted.   A-fib - continue anticoagulation and did a blocker currently rate controlled.  Dementia - Chronic continue to monitor and anticipate sundowning while in hospital. - haldol.    Code Status: full Family Communication: none  Disposition Plan: inpatient   Consultants:  none  Procedures:  Abd x-ray  Antibiotics:  Rocephin and azithro  HPI/Subjective: Complaining of abd pain  Objective: Filed Vitals:   04/15/14 2118 04/15/14 2142 04/16/14 0406 04/16/14 0644  BP:  166/82 151/61 136/62  Pulse: 94 95 101 105  Temp:  98.1 F (36.7 C) 99.9 F (37.7 C) 98.9 F (37.2 C)  TempSrc:  Oral Oral Oral  Resp: Height:   (1.549 m)    Weight:  69.3 kg (152 lb 12.5 oz)    SpO2: 92% 95% 93% 91%    Intake/Output Summary (Last 24 hours) at 04/16/14 1610 Last data filed at 04/16/14 0600  Gross per 24 hour  Intake 888.75 ml  Output   1450 ml  Net -561.25 ml   Filed Weights   04/15/14 2142  Weight: 69.3 kg (152 lb 12.5 oz)    Exam:  General: Alert, awake, oriented x1, in no acute distress.  HEENT: No  bruits, no goiter.  Heart: Regular rate and rhythm. Lungs: Good air movement, clear.  Abdomen: Soft, diffuse pain. Neuro: Grossly intact, nonfocal.   Data Reviewed: Basic Metabolic Panel:  Recent Labs Lab 04/15/14 1730 04/16/14 0725  NA 142 PENDING  K 4.6 PENDING  CL 104 PENDING  CO2 28 PENDING  GLUCOSE 148* 107*  BUN 34* 33*  CREATININE 1.75* 1.52*  CALCIUM 9.1 PENDING   Liver Function Tests:  Recent Labs Lab 04/16/14 0725  AST 17  ALT 10  ALKPHOS 74  BILITOT 0.5  PROT 6.2  ALBUMIN 3.7   No results for input(s): LIPASE, AMYLASE in the last 168 hours. No results for input(s): AMMONIA in the last 168 hours. CBC:  Recent Labs Lab 04/15/14 1730 04/16/14 0725  WBC 14.5* 12.2*  NEUTROABS 10.4* 9.6*  HGB 13.0 11.5*  HCT 40.9 37.0  MCV 98.1 97.9  PLT 207 178   Cardiac Enzymes: No results for input(s): CKTOTAL, CKMB, CKMBINDEX, TROPONINI in the last 168 hours. BNP (last 3 results) No results for input(s): BNP in the last 8760 hours.  ProBNP (last 3 results)  Recent Labs  11/18/13 2157  PROBNP 1073.0*    CBG: No results for input(s): GLUCAP in the last 168 hours.  No results found for this or any previous visit (from the past 240 hour(s)).   Studies: Dg Abd Acute W/chest  04/15/2014   CLINICAL DATA:  Abdominal distention.  No bowel movement in 2 weeks.  EXAM: ACUTE ABDOMEN SERIES (ABDOMEN 2 VIEW & CHEST 1 VIEW)  COMPARISON:  04/03/2014  FINDINGS: Cardiac enlargement. Pulmonary vascularity appears normal. Scattered fibrosis in the lungs. Increased density in the right upper lung suggests infiltration or atelectasis. Pneumonia should be excluded. No blunting of costophrenic angles. No pneumothorax. Calcified and tortuous aorta. Surgical clips in the right axilla. Large esophageal hiatal hernia behind the heart.  Diffusely stool-filled colon with scattered gas in the colon and small bowel. No small or large bowel distention. No free intra-abdominal air. No  abnormal air-fluid levels. No radiopaque stones identified. Vascular calcifications. Degenerative changes in the spine and hips.  IMPRESSION: Cardiac enlargement without vascular congestion. Infiltration or atelectasis in the right upper lung possibly due to pneumonia. Diffusely stool-filled colon consistent with clinical history of constipation. No evidence of bowel obstruction or free air.   Electronically Signed   By: Burman NievesWilliam  Stevens M.D.   On: 04/15/2014 18:03   Dg Abd Portable 1v  04/16/2014   CLINICAL DATA:  Obstipation.  EXAM: PORTABLE ABDOMEN - 1 VIEW  COMPARISON:  04/15/2014  FINDINGS: Diffusely stool-filled colon with scattered gas in the colon and small bowel. No small or large bowel distention. Findings are consistent with clinical constipation. No radiopaque stones. Vascular calcifications. Degenerative changes in the spine and hips.  IMPRESSION: Diffusely stool-filled colon without evidence of obstruction.   Electronically Signed   By: Burman NievesWilliam  Stevens M.D.   On: 04/16/2014 00:00    Scheduled Meds: . azithromycin  500 mg Intravenous QHS  . cefTRIAXone (ROCEPHIN)  IV  1 g Intravenous Q24H  . dabigatran  75 mg Oral Q12H  . darifenacin  15 mg Oral Daily  . docusate sodium  100 mg Oral BID  . memantine  28 mg Oral Daily  . metoprolol succinate  25 mg Oral Daily  . niacin  100 mg Oral Q breakfast  . pneumococcal 23 valent vaccine  0.5 mL Intramuscular Tomorrow-1000  . polyethylene glycol  17 g Oral BID  . senna-docusate  1 tablet Oral QHS  . simvastatin  10 mg Oral QHS  . sorbitol, milk of mag, mineral oil, glycerin (SMOG) enema  960 mL Rectal Once   Continuous Infusions:    Marinda ElkFELIZ ORTIZ, ABRAHAM  [facility] Pager [phone]. If 8PM-8AM, please contact night-coverage at [email or web address], password [email or web address] 04/16/2014, 8:22 AM  LOS: 1 day

## 2014-04-16 NOTE — Care Management Note (Addendum)
    Page 1 of 1   04/18/2014     2:35:39 PM CARE MANAGEMENT NOTE 04/18/2014  Patient:  Carolyn Sanders,Carolyn Sanders   Account Number:  [id number]  Date Initiated:  04/16/2014  Documentation initiated by:  Lanier ClamMAHABIR,Grenda Carolyn  Subjective/Objective Assessment:   81 y/o f admitted w/constipation, uti.ZO:XWRUEAVW,UJWHx:dementia,cva.     Action/Plan:   From home.   Anticipated DC Date:  04/18/2014   Anticipated DC Plan:  HOME W HOME HEALTH SERVICES      DC Planning Services  CM consult      Choice offered to / List presented to:  C-4 Adult Children        HH arranged  HH-2 PT      [facility] agency  [facility]   Status of service:  Completed, signed off Medicare Important Message given?  YES (If response is "NO", the following Medicare IM given date fields will be blank) Date Medicare IM given:  04/18/2014 Medicare IM given by:  [facility]Ismerai Bin Date Additional Medicare IM given:   Additional Medicare IM given by:    Discharge Disposition:  HOME W HOME HEALTH SERVICES  Per UR Regulation:  Reviewed for med. necessity/level of care/duration of stay  If discussed at Long Length of Stay Meetings, dates discussed:    Comments:  04/18/14 Lanier ClamKathy Sofia Jaquith RN BSN NCM [phone] Received call from dtr-Carolyn Sanders-informed her of HHC ordered-HHPT, & medicare im notice. [facility]TC [facility] rep Kristen aware of HHPT order, & d/c.Carolyn Cosierheresa stated she will come to hospital around 4p to pick her mother up to take home.Nurse updated. No further d/c needs. PT-HH.Left vm w/dtr/son to discuss d/c plans.Per son Carolyn Cosierheresa will be here today.Left HHC agency list in rm, & medicare im.  04/16/14 Lanier ClamKathy Cyprus Kuang RN BSN NCM [phone] Recommend PT cons.Monitor progress for d/c needs.

## 2014-04-17 ENCOUNTER — Inpatient Hospital Stay (HOSPITAL_COMMUNITY): Payer: Medicare Other

## 2014-04-17 LAB — GLUCOSE, CAPILLARY: GLUCOSE-CAPILLARY: 107 mg/dL — AB (ref 70–99)

## 2014-04-17 NOTE — Clinical Documentation Improvement (Signed)
  Zocor 10 mg po q hs provided this admission.  Please document a diagnosis/condition requiring the administration of Zocor.   Thank You, Jerral Ralphathy R Alexiz Sustaita ,RN Clinical Documentation Specialist:  [phone] [facility]- Health Information Management

## 2014-04-17 NOTE — Progress Notes (Signed)
[facility] PROGRESS NOTE Assessment/Plan: Constipation/Obstipation - Status post disimpaction in the ED, KUB appears improved. - Start SMOG enema and golytely. - Having large amounts of stools today. - home in am.  Infiltrate versus atelectasis on chest x-ray:-  - She has had no fever she has a mild white count could that could be due to the obstipation. She was started on empiric antibiotics on admission she has no sinus or breath or cough. - Antibiotics stop. And has remained afebrile.  Chronic renal disease, stage 3, moderately decreased glomerular filtration rate (GFR) between 30-59 mL/min/1.73 square meter - Creatinine improved with hydration. DC the renal ultrasound. KVO IV fluids check a basic metabolic panel in the morning.  Urinary retention - ? Could be due to constipation. Had about 1.5 L when  Foley was inserted. - d/c foley   A-fib - continue anticoagulation and did a blocker currently rate controlled.  Dementia - Chronic continue to monitor and anticipate sundowning while in hospital. - haldol.    Code Status: full Family Communication: none  Disposition Plan: inpatient   Consultants:  none  Procedures:  Abd x-ray  Antibiotics:  Rocephin and azithro  HPI/Subjective: Abd pain improved.  Objective: Filed Vitals:   04/16/14 2141 04/16/14 2146 04/17/14 0253 04/17/14 0528  BP: 124/69  136/64 140/80  Pulse: 105 117 103 105  Temp: 98 F (36.7 C)  98 F (36.7 C) 99.5 F (37.5 C)  TempSrc: Oral  Oral Oral  Resp: 20   20  Height:      Weight:      SpO2: 89% 93% 93% 95%    Intake/Output Summary (Last 24 hours) at 04/17/14 1047 Last data filed at 04/17/14 1044  Gross per 24 hour  Intake 2741.25 ml  Output    925 ml  Net 1816.25 ml   Filed Weights   04/15/14 2142  Weight: 69.3 kg (152 lb 12.5 oz)    Exam:  General: Alert, awake, oriented x1, in no acute distress.  HEENT: No bruits, no goiter.  Heart: Regular rate and  rhythm. Lungs: Good air movement, clear.  Abdomen: Soft, diffuse pain. Neuro: Grossly intact, nonfocal.   Data Reviewed: Basic Metabolic Panel:  Recent Labs Lab 04/15/14 1730 04/16/14 0725  NA 142 138  K 4.6 3.9  CL 104 101  CO2 28 29  GLUCOSE 148* 107*  BUN 34* 33*  CREATININE 1.75* 1.52*  CALCIUM 9.1 8.2*   Liver Function Tests:  Recent Labs Lab 04/16/14 0725  AST 17  ALT 10  ALKPHOS 74  BILITOT 0.5  PROT 6.2  ALBUMIN 3.7   No results for input(s): LIPASE, AMYLASE in the last 168 hours. No results for input(s): AMMONIA in the last 168 hours. CBC:  Recent Labs Lab 04/15/14 1730 04/16/14 0725  WBC 14.5* 12.2*  NEUTROABS 10.4* 9.6*  HGB 13.0 11.5*  HCT 40.9 37.0  MCV 98.1 97.9  PLT 207 178   Cardiac Enzymes: No results for input(s): CKTOTAL, CKMB, CKMBINDEX, TROPONINI in the last 168 hours. BNP (last 3 results) No results for input(s): BNP in the last 8760 hours.  ProBNP (last 3 results)  Recent Labs  11/18/13 2157  PROBNP 1073.0*    CBG: No results for input(s): GLUCAP in the last 168 hours.  No results found for this or any previous visit (from the past 240 hour(s)).   Studies: Dg Abd Acute W/chest  04/15/2014   CLINICAL DATA:  Abdominal distention.  No bowel movement in 2 weeks.  EXAM: ACUTE ABDOMEN SERIES (ABDOMEN 2 VIEW & CHEST 1 VIEW)  COMPARISON:  04/03/2014  FINDINGS: Cardiac enlargement. Pulmonary vascularity appears normal. Scattered fibrosis in the lungs. Increased density in the right upper lung suggests infiltration or atelectasis. Pneumonia should be excluded. No blunting of costophrenic angles. No pneumothorax. Calcified and tortuous aorta. Surgical clips in the right axilla. Large esophageal hiatal hernia behind the heart.  Diffusely stool-filled colon with scattered gas in the colon and small bowel. No small or large bowel distention. No free intra-abdominal air. No abnormal air-fluid levels. No radiopaque stones identified. Vascular  calcifications. Degenerative changes in the spine and hips.  IMPRESSION: Cardiac enlargement without vascular congestion. Infiltration or atelectasis in the right upper lung possibly due to pneumonia. Diffusely stool-filled colon consistent with clinical history of constipation. No evidence of bowel obstruction or free air.   Electronically Signed   By: Burman Nieves M.D.   On: 04/15/2014 18:03   Dg Abd Portable 1v  04/16/2014   CLINICAL DATA:  Obstipation.  EXAM: PORTABLE ABDOMEN - 1 VIEW  COMPARISON:  04/15/2014  FINDINGS: Diffusely stool-filled colon with scattered gas in the colon and small bowel. No small or large bowel distention. Findings are consistent with clinical constipation. No radiopaque stones. Vascular calcifications. Degenerative changes in the spine and hips.  IMPRESSION: Diffusely stool-filled colon without evidence of obstruction.   Electronically Signed   By: Burman Nieves M.D.   On: 04/16/2014 00:00    Scheduled Meds: . azithromycin  500 mg Intravenous QHS  . cefTRIAXone (ROCEPHIN)  IV  1 g Intravenous Q24H  . dabigatran  75 mg Oral Q12H  . darifenacin  15 mg Oral Daily  . docusate sodium  100 mg Oral BID  . memantine  28 mg Oral Daily  . metoprolol succinate  25 mg Oral Daily  . niacin  100 mg Oral Q breakfast  . polyethylene glycol  17 g Oral BID  . senna-docusate  1 tablet Oral QHS  . simvastatin  10 mg Oral QHS   Continuous Infusions:    Marinda Elk  [facility] Pager [phone]. If 8PM-8AM, please contact night-coverage at [email or web address], password [facility] 04/17/2014, 10:47 AM  LOS: 2 days

## 2014-04-17 NOTE — Evaluation (Signed)
Physical Therapy Evaluation Patient Details Name: Carolyn Sanders MRN: [id number] DOB: 07/21/1925 Today's Date: 04/17/2014   History of Present Illness  81 yo female admitted with  severe constipation on2/7/16, re4cently treated for UTI.  Clinical Impression  Patient pleasantly confused. No family present for info regarding 24/7 caregivers and PFL. Pt will benefit from PT to address problems listed in note below.    Follow Up Recommendations Home health PT;SNF;Supervision/Assistance - 24 hour (need to confirm with family DC plan)    Equipment Recommendations  None recommended by PT    Recommendations for Other Services       Precautions / Restrictions Precautions Precaution Comments: incontinence      Mobility  Bed Mobility Overal bed mobility: Needs Assistance Bed Mobility: Supine to Sit;Sit to Supine     Supine to sit: Min assist Sit to supine: Min assist      Transfers Overall transfer level: Needs assistance   Transfers: Sit to/from [employer] Transfers Sit to Stand: Mod assist Stand pivot transfers: Mod assist       General transfer comment: bear hug approach, extra time to take shuffle steps from bed to [facility] and back to bed, stands for  Hygiene after pottying.  Ambulation/Gait                Stairs            Wheelchair Mobility    Modified Rankin (Stroke Patients Only)       Balance Overall balance assessment: Needs assistance         Standing balance support: During functional activity;Bilateral upper extremity supported Standing balance-Leahy Scale: Poor                               Pertinent Vitals/Pain Pain Assessment: No/denies pain    Home Living Family/patient expects to be discharged to:: Unsure                 Additional Comments: pt reports living with aspouse and daughter, h/o dementia, though, no family present.    Prior Function           Comments: uncertain     Hand  Dominance        Extremity/Trunk Assessment               Lower Extremity Assessment: Generalized weakness         Communication   Communication: HOH  Cognition Arousal/Alertness: Awake/alert Behavior During Therapy: WFL for tasks assessed/performed Overall Cognitive Status: No family/caregiver present to determine baseline cognitive functioning       Memory: Decreased short-term memory              General Comments      Exercises        Assessment/Plan    PT Assessment Patient needs continued PT services  PT Diagnosis Difficulty walking;Generalized weakness;Altered mental status   PT Problem List Decreased strength;Decreased activity tolerance;Decreased mobility;Decreased knowledge of precautions;Decreased safety awareness;Decreased knowledge of use of DME  PT Treatment Interventions DME instruction;Gait training;Functional mobility training;Therapeutic activities;Therapeutic exercise;Patient/family education   PT Goals (Current goals can be found in the Care Plan section) Acute Rehab PT Goals PT Goal Formulation: Patient unable to participate in goal setting Time For Goal Achievement: 05/01/14 Potential to Achieve Goals: Fair    Frequency Min 3X/week   Barriers to discharge        Co-evaluation  End of Session   Activity Tolerance: Patient tolerated treatment well Patient left: in bed;with call bell/phone within reach;with bed alarm set Nurse Communication: Mobility status         Time: [id number] PT Time Calculation (min) (ACUTE ONLY): 13 min   Charges:   PT Evaluation $Initial PT Evaluation Tier I: 1 Procedure     PT G CodesRada Hay:        Jennesis Ramaswamy Elizabeth 04/17/2014, 5:28 PM Blanchard KelchKaren Phyllis Abelson PT [phone]

## 2014-04-18 ENCOUNTER — Inpatient Hospital Stay (HOSPITAL_COMMUNITY): Payer: Medicare Other

## 2014-04-18 DIAGNOSIS — I4891 Unspecified atrial fibrillation: Secondary | ICD-10-CM

## 2014-04-18 DIAGNOSIS — F0390 Unspecified dementia without behavioral disturbance: Secondary | ICD-10-CM

## 2014-04-18 LAB — LEGIONELLA ANTIGEN, URINE

## 2014-04-18 MED ORDER — POLYETHYLENE GLYCOL 3350 17 G PO PACK: 17.0000 g | PACK | Freq: Two times a day (BID) | ORAL | Status: AC

## 2014-04-18 MED ORDER — SENNOSIDES-DOCUSATE SODIUM 8.6-50 MG PO TABS: 1.0000 | ORAL_TABLET | Freq: Every day | ORAL | Status: DC

## 2014-04-18 MED ORDER — BISACODYL 10 MG RE SUPP: 10.0000 mg | Freq: Every day | RECTAL | Status: DC | PRN

## 2014-04-18 NOTE — Discharge Summary (Addendum)
Physician Discharge Summary  Carolyn Sanders:[id number] DOB: 1925/10/06 DOA: 04/15/2014  PCP: Zoila Shutter, MD  Admit date: 04/15/2014 Discharge date: 04/18/2014  Time spent: 45 minutes  Recommendations for Outpatient Follow-up:  1. Cont to follow closely to prevent consitpation   Discharge Condition: stable Diet recommendation: heart healthy  Discharge Diagnoses:  Principal Problem:   Obstipation Active Problems:   Urinary retention   Chronic renal disease, stage 3, moderately decreased glomerular filtration rate (GFR) between 30-59 mL/min/1.73 square meter   A-fib   Dementia   History of present illness:  Carolyn Sanders is a 81 y.o. female   has a past medical history of Stroke; Coronary artery disease; Atrial fibrillation; and Hypertension.  Family states patient have not had a BM 2 weeks. She was seen at Urgent care 1 week ago and was treated for UTI. She was told that she is constipated and was treated with miralax and fleet enema with no results. She has been short of breath and have had some cough. She is on home oxygen 2 L at night. In ER WBC 14.5. Xrays showed and infiltrated in RUL and large amount of stool - patient was disimpacted.   Per family she had not had good urine output for the past 4 Days. A foley was placed and urine was obtained. 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   Hospital Course:  Constipation/Obstipation - Status post disimpaction in the ED, KUB appears improved. - given SMOG enema and golytely. - Having large amounts of stools now- repeat Xray reveals significant improvement- see below - she is being given a regimen of Miralax, Senna/ Colace and Dulcolax on d/c.   Infiltrate versus atelectasis on chest x-ray:-  - She has had no fever - she  has a mild white count could that could be due to the obstipation. She was started on empiric antibiotics on admission she has no sinus or breath or cough. - Antibiotics stopped- has remained afebrile.  Chronic renal disease, stage 3, moderately decreased glomerular filtration rate (GFR) between 30-59 mL/min/1.73 square meter - Creatinine improved with hydration. DC the renal ultrasound. KVO IV fluids check a basic metabolic panel in the morning.  Urinary retention - ? Could be due to constipation. Had about 1.5 L when Foley was inserted. - d/c'd foley- voiding normally  A-fib - continue anticoagulation and Metoprolol currently rate controlled.  Dementia - Chronic continue to monitor and anticipate sundowning while in hospital. - haldol.   Discharge Exam: Filed Weights   04/15/14 2142  Weight: 69.3 kg (152 lb 12.5 oz)   Filed Vitals:   04/18/14 0545  BP: 136/74  Pulse: 98  Temp: 98.1 F (36.7 C)  Resp: 16    General: AA but disoriented, no distress Cardiovascular: RRR, no murmurs  Respiratory: clear to auscultation bilaterally GI: soft, non-tender, non-distended, bowel sound positive  Discharge Instructions You were cared for by a hospitalist during your hospital stay. If you have any questions about your discharge medications or the care you received while you were in the hospital after you are discharged, you can call the unit and asked to speak with the hospitalist on call if the hospitalist that took care of you is not available. Once you are discharged, your primary care physician will  handle any further medical issues. Please note that NO REFILLS for any discharge medications will be authorized once you are discharged, as it is imperative that you return to your primary care physician (or establish a relationship with a primary care physician if you do not have one) for your aftercare needs so that they can reassess your need for medications and monitor your lab  values.      Discharge Instructions    Diet - low sodium heart healthy    Complete by:  As directed      Increase activity slowly    Complete by:  As directed             Medication List    TAKE these medications        bisacodyl 10 MG suppository  Commonly known as:  DULCOLAX  Place 1 suppository (10 mg total) rectally daily as needed for moderate constipation.     dabigatran 75 MG Caps capsule  Commonly known as:  PRADAXA  Take 75 mg by mouth every 12 (twelve) hours.     hydrochlorothiazide 25 MG tablet  Commonly known as:  HYDRODIURIL  Take 25 mg by mouth daily.     memantine 28 MG Cp24 24 hr capsule  Commonly known as:  NAMENDA XR  Take 28 mg by mouth daily.     metoprolol succinate 25 MG 24 hr tablet  Commonly known as:  TOPROL-XL  Take 25 mg by mouth daily.     niacin 100 MG tablet  Take 100 mg by mouth daily with breakfast.     polyethylene glycol packet  Commonly known as:  MIRALAX / GLYCOLAX  Take 17 g by mouth 2 (two) times daily.     senna-docusate 8.6-50 MG per tablet  Commonly known as:  Senokot-S  Take 1 tablet by mouth at bedtime.     simvastatin 10 MG tablet  Commonly known as:  ZOCOR  Take 10 mg by mouth at bedtime.     solifenacin 10 MG tablet  Commonly known as:  VESICARE  Take 10 mg by mouth daily.     telmisartan 40 MG tablet  Commonly known as:  MICARDIS  Take 40 mg by mouth daily.       No Known Allergies    The results of significant diagnostics from this hospitalization (including imaging, microbiology, ancillary and laboratory) are listed below for reference.    Significant Diagnostic Studies: Dg Abd 1 View  04/17/2014   CLINICAL DATA:  Face impaction.  EXAM: ABDOMEN - 1 VIEW  COMPARISON:  04/15/2014.  FINDINGS: Soft tissue structures are unremarkable. Prominent stool again noted throughout the colon particularly in the rectum. Air-filled loops of small and large bowel noted suggesting adynamic ileus. Aortoiliac  excluded-disease. No acute bony abnormality.  IMPRESSION: Prominent amount of stool remains throughout the ankle project in the rectum. Distended loops of bowel again noted suggesting adynamic ileus.   Electronically Signed   By: Maisie Fushomas  Register   On: 04/17/2014 11:56   Dg Abd Acute W/chest  04/15/2014   CLINICAL DATA:  Abdominal distention.  No bowel movement in 2 weeks.  EXAM: ACUTE ABDOMEN SERIES (ABDOMEN 2 VIEW & CHEST 1 VIEW)  COMPARISON:  04/03/2014  FINDINGS: Cardiac enlargement. Pulmonary vascularity appears normal. Scattered fibrosis in the lungs. Increased density in the right upper lung suggests infiltration or atelectasis. Pneumonia should be excluded. No blunting of costophrenic angles. No pneumothorax. Calcified and tortuous aorta. Surgical clips in the right axilla. Large  esophageal hiatal hernia behind the heart.  Diffusely stool-filled colon with scattered gas in the colon and small bowel. No small or large bowel distention. No free intra-abdominal air. No abnormal air-fluid levels. No radiopaque stones identified. Vascular calcifications. Degenerative changes in the spine and hips.  IMPRESSION: Cardiac enlargement without vascular congestion. Infiltration or atelectasis in the right upper lung possibly due to pneumonia. Diffusely stool-filled colon consistent with clinical history of constipation. No evidence of bowel obstruction or free air.   Electronically Signed   By: Burman Nieves M.D.   On: 04/15/2014 18:03   Dg Abd Portable 1v  04/18/2014   CLINICAL DATA:  81 year old female with constipation, abdominal pain and distension  EXAM: PORTABLE ABDOMEN - 1 VIEW  COMPARISON:  Prior abdominal radiograph 04/17/2014  FINDINGS: Significant interval decrease in stool overlying the rectum. Gas noted throughout both large and small bowel without evidence of significant distension or obstruction. There is mild thickening of the cysts mucosal wall of the rectum. The bones appear osteopenic. No  acute osseous abnormality.  IMPRESSION: 1. Significant interval decrease in the rectal stool burden. 2. Mild thickening of the rectal wall may represent changes related to stercoral colitis. 3. No evidence of obstruction.   Electronically Signed   By: Malachy Moan M.D.   On: 04/18/2014 12:26   Dg Abd Portable 1v  04/16/2014   CLINICAL DATA:  Obstipation.  EXAM: PORTABLE ABDOMEN - 1 VIEW  COMPARISON:  04/15/2014  FINDINGS: Diffusely stool-filled colon with scattered gas in the colon and small bowel. No small or large bowel distention. Findings are consistent with clinical constipation. No radiopaque stones. Vascular calcifications. Degenerative changes in the spine and hips.  IMPRESSION: Diffusely stool-filled colon without evidence of obstruction.   Electronically Signed   By: Burman Nieves M.D.   On: 04/16/2014 00:00    Microbiology: No results found for this or any previous visit (from the past 240 hour(s)).   Labs: Basic Metabolic Panel:  Recent Labs Lab 04/15/14 1730 04/16/14 0725  NA 142 138  K 4.6 3.9  CL 104 101  CO2 28 29  GLUCOSE 148* 107*  BUN 34* 33*  CREATININE 1.75* 1.52*  CALCIUM 9.1 8.2*   Liver Function Tests:  Recent Labs Lab 04/16/14 0725  AST 17  ALT 10  ALKPHOS 74  BILITOT 0.5  PROT 6.2  ALBUMIN 3.7   No results for input(s): LIPASE, AMYLASE in the last 168 hours. No results for input(s): AMMONIA in the last 168 hours. CBC:  Recent Labs Lab 04/15/14 1730 04/16/14 0725  WBC 14.5* 12.2*  NEUTROABS 10.4* 9.6*  HGB 13.0 11.5*  HCT 40.9 37.0  MCV 98.1 97.9  PLT 207 178   Cardiac Enzymes: No results for input(s): CKTOTAL, CKMB, CKMBINDEX, TROPONINI in the last 168 hours. BNP: BNP (last 3 results) No results for input(s): BNP in the last 8760 hours.  ProBNP (last 3 results)  Recent Labs  11/18/13 2157  PROBNP 1073.0*    CBG:  Recent Labs Lab 04/17/14 0744  GLUCAP 107*       SignedCalvert Cantor, MD [facility] 04/18/2014, 12:49 PM

## 2014-05-08 ENCOUNTER — Emergency Department (HOSPITAL_COMMUNITY): Payer: Medicare Other

## 2014-05-08 ENCOUNTER — Encounter (HOSPITAL_COMMUNITY): Payer: Self-pay | Admitting: *Deleted

## 2014-05-08 ENCOUNTER — Inpatient Hospital Stay (HOSPITAL_COMMUNITY)
Admission: EM | Admit: 2014-05-08 | Discharge: 2014-05-12 | DRG: 193 | Disposition: A | Discharge status: Skilled Nursing Facility | Payer: Medicare Other | Attending: Internal Medicine | Admitting: Internal Medicine

## 2014-05-08 DIAGNOSIS — R2681 Unsteadiness on feet: Secondary | ICD-10-CM | POA: Diagnosis present

## 2014-05-08 DIAGNOSIS — N183 Chronic kidney disease, stage 3 unspecified: Secondary | ICD-10-CM | POA: Diagnosis present

## 2014-05-08 DIAGNOSIS — Z803 Family history of malignant neoplasm of breast: Secondary | ICD-10-CM

## 2014-05-08 DIAGNOSIS — Y95 Nosocomial condition: Secondary | ICD-10-CM | POA: Diagnosis present

## 2014-05-08 DIAGNOSIS — F0280 Dementia in other diseases classified elsewhere without behavioral disturbance: Secondary | ICD-10-CM | POA: Diagnosis present

## 2014-05-08 DIAGNOSIS — I482 Chronic atrial fibrillation, unspecified: Secondary | ICD-10-CM | POA: Diagnosis present

## 2014-05-08 DIAGNOSIS — I10 Essential (primary) hypertension: Secondary | ICD-10-CM | POA: Diagnosis present

## 2014-05-08 DIAGNOSIS — R531 Weakness: Secondary | ICD-10-CM | POA: Diagnosis not present

## 2014-05-08 DIAGNOSIS — G9340 Encephalopathy, unspecified: Secondary | ICD-10-CM | POA: Diagnosis present

## 2014-05-08 DIAGNOSIS — R410 Disorientation, unspecified: Secondary | ICD-10-CM

## 2014-05-08 DIAGNOSIS — Z8701 Personal history of pneumonia (recurrent): Secondary | ICD-10-CM

## 2014-05-08 DIAGNOSIS — F0390 Unspecified dementia without behavioral disturbance: Secondary | ICD-10-CM | POA: Diagnosis present

## 2014-05-08 DIAGNOSIS — Z79899 Other long term (current) drug therapy: Secondary | ICD-10-CM

## 2014-05-08 DIAGNOSIS — R509 Fever, unspecified: Secondary | ICD-10-CM

## 2014-05-08 DIAGNOSIS — J189 Pneumonia, unspecified organism: Secondary | ICD-10-CM | POA: Diagnosis not present

## 2014-05-08 DIAGNOSIS — Z8673 Personal history of transient ischemic attack (TIA), and cerebral infarction without residual deficits: Secondary | ICD-10-CM

## 2014-05-08 DIAGNOSIS — K5900 Constipation, unspecified: Secondary | ICD-10-CM | POA: Diagnosis present

## 2014-05-08 DIAGNOSIS — I2510 Atherosclerotic heart disease of native coronary artery without angina pectoris: Secondary | ICD-10-CM | POA: Diagnosis present

## 2014-05-08 DIAGNOSIS — I129 Hypertensive chronic kidney disease with stage 1 through stage 4 chronic kidney disease, or unspecified chronic kidney disease: Secondary | ICD-10-CM | POA: Diagnosis present

## 2014-05-08 DIAGNOSIS — G309 Alzheimer's disease, unspecified: Secondary | ICD-10-CM | POA: Diagnosis present

## 2014-05-08 DIAGNOSIS — R627 Adult failure to thrive: Secondary | ICD-10-CM | POA: Diagnosis present

## 2014-05-08 HISTORY — DX: Unspecified dementia, unspecified severity, without behavioral disturbance, psychotic disturbance, mood disturbance, and anxiety: F03.90

## 2014-05-08 LAB — I-STAT TROPONIN, ED: Troponin i, poc: 0.02 ng/mL (ref 0.00–0.08)

## 2014-05-08 LAB — URINALYSIS, ROUTINE W REFLEX MICROSCOPIC
Bilirubin Urine: NEGATIVE
Glucose, UA: NEGATIVE mg/dL
Hgb urine dipstick: NEGATIVE
Ketones, ur: NEGATIVE mg/dL
Leukocytes, UA: NEGATIVE
Nitrite: NEGATIVE
Protein, ur: NEGATIVE mg/dL
Specific Gravity, Urine: 1.015 (ref 1.005–1.030)
Urobilinogen, UA: 0.2 mg/dL (ref 0.0–1.0)
pH: 5 (ref 5.0–8.0)

## 2014-05-08 LAB — CBC WITH DIFFERENTIAL/PLATELET
Basophils Absolute: 0 K/uL (ref 0.0–0.1)
Basophils Relative: 1 % (ref 0–1)
Eosinophils Absolute: 0.2 K/uL (ref 0.0–0.7)
Eosinophils Relative: 2 % (ref 0–5)
HCT: 42.2 % (ref 36.0–46.0)
Hemoglobin: 13.4 g/dL (ref 12.0–15.0)
Lymphocytes Relative: 13 % (ref 12–46)
Lymphs Abs: 0.9 K/uL (ref 0.7–4.0)
MCH: 30.7 pg (ref 26.0–34.0)
MCHC: 31.8 g/dL (ref 30.0–36.0)
MCV: 96.8 fL (ref 78.0–100.0)
Monocytes Absolute: 1.2 K/uL — ABNORMAL HIGH (ref 0.1–1.0)
Monocytes Relative: 16 % — ABNORMAL HIGH (ref 3–12)
Neutro Abs: 5.1 K/uL (ref 1.7–7.7)
Neutrophils Relative %: 68 % (ref 43–77)
Platelets: 170 K/uL (ref 150–400)
RBC: 4.36 MIL/uL (ref 3.87–5.11)
RDW: 14.8 % (ref 11.5–15.5)
WBC: 7.4 K/uL (ref 4.0–10.5)

## 2014-05-08 LAB — I-STAT CHEM 8, ED
BUN: 33 mg/dL — ABNORMAL HIGH (ref 6–23)
CALCIUM ION: 1.05 mmol/L — AB (ref 1.13–1.30)
Chloride: 106 mmol/L (ref 96–112)
Creatinine, Ser: 1.4 mg/dL — ABNORMAL HIGH (ref 0.50–1.10)
Glucose, Bld: 112 mg/dL — ABNORMAL HIGH (ref 70–99)
HCT: 43 % (ref 36.0–46.0)
HEMOGLOBIN: 14.6 g/dL (ref 12.0–15.0)
Potassium: 4.4 mmol/L (ref 3.5–5.1)
SODIUM: 139 mmol/L (ref 135–145)
TCO2: 23 mmol/L (ref 0–100)

## 2014-05-08 MED ORDER — DEXTROSE 5 % IV SOLN
1.0000 g | Freq: Once | INTRAVENOUS | Status: AC
  Administered 2014-05-09: 1 g via INTRAVENOUS
  Filled 2014-05-08: qty 1

## 2014-05-08 NOTE — ED Provider Notes (Addendum)
CSN: [id number]     Arrival date & time 05/08/14  1924 History   First MD Initiated Contact with Patient 05/08/14 1936     Chief Complaint  Patient presents with  . Weakness     (Consider location/radiation/quality/duration/timing/severity/associated sxs/prior Treatment) Patient is a 81 y.o. female presenting with weakness. The history is provided by a caregiver. The history is limited by the condition of the patient.  Weakness Associated symptoms include coughing, fatigue and weakness. Pertinent negatives include no vomiting.   Carolyn Sanders is a 81 y.o. female with a pasta medical history of Alzheimers CAd, a fib presenting with her daughter who is her primary caregiver presenting with increased weakness and sleepiness today per daughters report.  Alzheimers prevents pt from giving a meaningful history.  Daughter states she has slept more than normal today and has had decreased appetite but has been willing to drink fluids.  She has had no vomiting or diarrhea. She has had an increased dry sounding cough without any recognized fevers. She was admitted here 3 weeks ago for possible pneumonia, constipation and acute kidney injury.     Past Medical History  Diagnosis Date  . Stroke   . Coronary artery disease   . Atrial fibrillation   . Hypertension   . Dementia    History reviewed. No pertinent past surgical history. Family History  Problem Relation Age of Onset  . Breast cancer Mother    History  Substance Use Topics  . Smoking status: Never Smoker   . Smokeless tobacco: Never Used  . Alcohol Use: No   OB History    No data available     Review of Systems  Unable to perform ROS: Dementia  Constitutional: Positive for activity change, appetite change and fatigue.       ROS limited secondary to dementia.  Respiratory: Positive for cough.   Gastrointestinal: Negative for vomiting and diarrhea.  Neurological: Positive for weakness.      Allergies  Review of  patient's allergies indicates no known allergies.  Home Medications   Prior to Admission medications   Medication Sig Start Date End Date Taking? Authorizing Provider  Calcium Carbonate-Vit D-Min (CALCIUM 600+D PLUS MINERALS) 600-400 MG-UNIT TABS Take 1 tablet by mouth daily.   Yes Historical Provider, MD  dabigatran (PRADAXA) 75 MG CAPS Take 75 mg by mouth every 12 (twelve) hours.     Yes Historical Provider, MD  diltiazem (DILACOR XR) 180 MG 24 hr capsule Take 180 mg by mouth daily.   Yes Historical Provider, MD  docusate sodium (COLACE) 100 MG capsule Take 100 mg by mouth 2 (two) times daily.   Yes Historical Provider, MD  hydrALAZINE (APRESOLINE) 50 MG tablet Take 100 mg by mouth 2 (two) times daily.   Yes Historical Provider, MD  memantine (NAMENDA XR) 28 MG CP24 24 hr capsule Take 28 mg by mouth daily.   Yes Historical Provider, MD  metoprolol succinate (TOPROL-XL) 25 MG 24 hr tablet Take 25 mg by mouth daily.   Yes Historical Provider, MD  Multiple Vitamin (MULTIVITAMIN WITH MINERALS) TABS tablet Take 1 tablet by mouth daily.   Yes Historical Provider, MD  solifenacin (VESICARE) 10 MG tablet Take 10 mg by mouth daily.   Yes Historical Provider, MD  bisacodyl (DULCOLAX) 10 MG suppository Place 1 suppository (10 mg total) rectally daily as needed for moderate constipation. Patient not taking: Reported on 05/08/2014 04/18/14   Calvert CantorSaima Rizwan, MD  polyethylene glycol (MIRALAX / GLYCOLAX) packet Take 17  g by mouth 2 (two) times daily. Patient not taking: Reported on 05/08/2014 04/18/14   Calvert Cantor, MD  senna-docusate (SENOKOT-S) 8.6-50 MG per tablet Take 1 tablet by mouth at bedtime. Patient not taking: Reported on 05/08/2014 04/18/14   Calvert Cantor, MD   BP 139/68 mmHg  Pulse 88  Temp(Src) 100.4 F (38 C) (Oral)  Resp 16  SpO2 97% Physical Exam  Constitutional: She appears well-developed and well-nourished. No distress.  HENT:  Head: Normocephalic and atraumatic.  Eyes: Conjunctivae are  normal.  Neck: Normal range of motion.  Cardiovascular: Normal rate, regular rhythm, normal heart sounds and intact distal pulses.   Pulmonary/Chest: Effort normal. No respiratory distress. She has decreased breath sounds. She has no wheezes. She has no rhonchi.  Poor effort  Abdominal: Soft. Bowel sounds are normal. There is no tenderness.  Musculoskeletal: Normal range of motion.  Neurological: She is alert. GCS eye subscore is 4. GCS verbal subscore is 5. GCS motor subscore is 6.  Skin: Skin is warm and dry.  Psychiatric: She has a normal mood and affect. Cognition and memory are impaired.  Nursing note and vitals reviewed.   ED Course  Procedures (including critical care time) Labs Review Labs Reviewed  CBC WITH DIFFERENTIAL/PLATELET - Abnormal; Notable for the following:    Monocytes Relative 16 (*)    Monocytes Absolute 1.2 (*)    All other components within normal limits  I-STAT CHEM 8, ED - Abnormal; Notable for the following:    BUN 33 (*)    Creatinine, Ser 1.40 (*)    Glucose, Bld 112 (*)    Calcium, Ion 1.05 (*)    All other components within normal limits  URINALYSIS, ROUTINE W REFLEX MICROSCOPIC  BRAIN NATRIURETIC PEPTIDE  LACTIC ACID, PLASMA  I-STAT TROPOININ, ED    Imaging Review Dg Chest 2 View  05/08/2014   CLINICAL DATA:  Acute onset of generalized weakness. Initial encounter.  EXAM: CHEST  2 VIEW  COMPARISON:  Chest radiograph performed 04/15/2014  FINDINGS: The lungs are well-aerated. Mild patchy bilateral airspace opacities may reflect mild pneumonia or interstitial edema, with underlying vascular congestion. No pleural effusion or pneumothorax is seen.  The heart is mildly enlarged. A relatively large hiatal hernia is suggested. No acute osseous abnormalities are seen. Postoperative change is seen overlying the right axilla.  IMPRESSION: 1. Mild patchy bilateral airspace opacities may reflect mild pneumonia or interstitial edema, with underlying vascular  congestion. 2. Mild cardiomegaly. 3. Large hiatal hernia again noted.   Electronically Signed   By: Roanna Raider M.D.   On: 05/08/2014 21:52   Dg Abd 2 Views  05/08/2014   CLINICAL DATA:  Acute onset of abdominal distention and constipation for 2 days. Initial encounter.  EXAM: ABDOMEN - 2 VIEW  COMPARISON:  Abdominal radiograph performed 04/18/2014  FINDINGS: The visualized bowel gas pattern is unremarkable. Scattered air and stool filled loops of colon are seen; no abnormal dilatation of small bowel loops is seen to suggest small bowel obstruction. No free intra-abdominal air is identified on the provided decubitus view. The rectum is largely filled with air.  The visualized osseous structures are within normal limits; the sacroiliac joints are unremarkable in appearance. The visualized lung bases are essentially clear.  IMPRESSION: Unremarkable bowel gas pattern; no free intra-abdominal air seen. Moderate amount of stool noted in the colon. The rectum is largely filled with air.   Electronically Signed   By: Roanna Raider M.D.   On: 05/08/2014 21:54  EKG Interpretation None      MDM   Final diagnoses:  Weakness  Fever, unspecified fever cause    Patients labs and/or radiological studies were reviewed and considered during the medical decision making and disposition process.  Results were also discussed with patient. Pt with alzheimers, limited hx with increased drowsiness and fatigue, increased dry cough with fever.  CXR suggesting pneumonia.  Recent admission for constipation/acute kidney injury.  Will treat for possible hcap.  Admission anticipated.    Pt was seen by Dr. Madilyn Hook who recommends admission.    Spoke with Dr Toniann Fail who accepts pt for admission.     Burgess Amor, PA-C 05/09/14 0125  Tilden Fossa, MD 05/11/14 1453  Burgess Amor, PA-C 05/21/14 2359  Tilden Fossa, MD 05/22/14 [phone]

## 2014-05-08 NOTE — Progress Notes (Addendum)
EDCM spoke to patient and her daughter at bedside. Patient is confused.  Patient's daughter answered all of EDCM questions. Patient lives at home with her daughter Carolyn Sanders.  Patient has a wheel chair and oxygen at home.  Oxygen 2 liters worn only when patient is lying down per patient's daughter.  Oxygen delivered by [employer].  Patient has a walker and a can e at home but she doesn't use them per patient's daughter.  Per patient's daughter, patient's other daughter stays with the patient during the day and [name] stays with the patient at night.  Patient's [facility].  EDCM will discuss with EDP.  Patient's daughter reports she has been trying to find someone to stay with the patient.  EDCM will provide patient's daughter with list of private duty agencies.  EDCM explained to patient's daughter that private duty agency will be an out of pocket expense for her.  Patient's daughter thankful for services.  No further EDCM needs at this time.  2339pm EDCM went to speak to patient's daughter at bedside however she was not at bedside.  [facility]  Discussed with EDP, patient to be admitted.

## 2014-05-08 NOTE — ED Notes (Signed)
Pt is confused, not aware she is in the hospital, denies pain, denies pain w/ urination

## 2014-05-08 NOTE — ED Notes (Signed)
Pt from home w/ daughter, been having fever, fatigue and generalized weakness, pt does have hx of dementia

## 2014-05-08 NOTE — ED Notes (Signed)
Bed: [id number] Expected date:  Expected time:  Means of arrival:  Comments: EMS-weak/UTI

## 2014-05-08 NOTE — ED Notes (Signed)
Patient transported to X-ray 

## 2014-05-08 NOTE — Progress Notes (Signed)
CSW met with pt at bedside. Daughter was present. Pt appeared to be non communicative during the interview. Daughter confirmed that pt comes from home. She states that she along with the pts' husband, and other daughter are also within the home in [place]. She states that the pt's other daughter is at home 24/7.  Daughter confirms that the pt presents to [facility] due to generalized weakness. Daughter states that the family and pt are not interested in a facility at this time. Daughter states that the pt has a lot of family support. Daughter states that the pt needs assistance completing her ADL's.  Theresa/Daughter [phone]  Willette Brace [phone] ED CSW 05/08/2014 8:58 PM

## 2014-05-09 ENCOUNTER — Encounter (HOSPITAL_COMMUNITY): Payer: Self-pay | Admitting: Internal Medicine

## 2014-05-09 ENCOUNTER — Inpatient Hospital Stay (HOSPITAL_COMMUNITY): Payer: Medicare Other

## 2014-05-09 ENCOUNTER — Other Ambulatory Visit (HOSPITAL_COMMUNITY): Payer: Self-pay

## 2014-05-09 DIAGNOSIS — Z803 Family history of malignant neoplasm of breast: Secondary | ICD-10-CM | POA: Diagnosis not present

## 2014-05-09 DIAGNOSIS — G309 Alzheimer's disease, unspecified: Secondary | ICD-10-CM | POA: Diagnosis present

## 2014-05-09 DIAGNOSIS — R627 Adult failure to thrive: Secondary | ICD-10-CM | POA: Diagnosis present

## 2014-05-09 DIAGNOSIS — Z8673 Personal history of transient ischemic attack (TIA), and cerebral infarction without residual deficits: Secondary | ICD-10-CM | POA: Diagnosis not present

## 2014-05-09 DIAGNOSIS — N183 Chronic kidney disease, stage 3 (moderate): Secondary | ICD-10-CM | POA: Diagnosis present

## 2014-05-09 DIAGNOSIS — R531 Weakness: Secondary | ICD-10-CM | POA: Diagnosis present

## 2014-05-09 DIAGNOSIS — I482 Chronic atrial fibrillation, unspecified: Secondary | ICD-10-CM | POA: Diagnosis present

## 2014-05-09 DIAGNOSIS — R2681 Unsteadiness on feet: Secondary | ICD-10-CM | POA: Diagnosis present

## 2014-05-09 DIAGNOSIS — I2510 Atherosclerotic heart disease of native coronary artery without angina pectoris: Secondary | ICD-10-CM | POA: Diagnosis present

## 2014-05-09 DIAGNOSIS — J189 Pneumonia, unspecified organism: Secondary | ICD-10-CM | POA: Diagnosis present

## 2014-05-09 DIAGNOSIS — I129 Hypertensive chronic kidney disease with stage 1 through stage 4 chronic kidney disease, or unspecified chronic kidney disease: Secondary | ICD-10-CM | POA: Diagnosis present

## 2014-05-09 DIAGNOSIS — Z79899 Other long term (current) drug therapy: Secondary | ICD-10-CM | POA: Diagnosis not present

## 2014-05-09 DIAGNOSIS — F0390 Unspecified dementia without behavioral disturbance: Secondary | ICD-10-CM

## 2014-05-09 DIAGNOSIS — F0280 Dementia in other diseases classified elsewhere without behavioral disturbance: Secondary | ICD-10-CM | POA: Diagnosis present

## 2014-05-09 DIAGNOSIS — G9340 Encephalopathy, unspecified: Secondary | ICD-10-CM | POA: Diagnosis present

## 2014-05-09 DIAGNOSIS — Y95 Nosocomial condition: Secondary | ICD-10-CM | POA: Diagnosis present

## 2014-05-09 DIAGNOSIS — K5900 Constipation, unspecified: Secondary | ICD-10-CM | POA: Diagnosis present

## 2014-05-09 DIAGNOSIS — Z8701 Personal history of pneumonia (recurrent): Secondary | ICD-10-CM | POA: Diagnosis not present

## 2014-05-09 DIAGNOSIS — I10 Essential (primary) hypertension: Secondary | ICD-10-CM | POA: Diagnosis present

## 2014-05-09 LAB — CBC WITH DIFFERENTIAL/PLATELET
Basophils Absolute: 0 10*3/uL (ref 0.0–0.1)
Basophils Relative: 1 % (ref 0–1)
Eosinophils Absolute: 0.1 10*3/uL (ref 0.0–0.7)
Eosinophils Relative: 2 % (ref 0–5)
HCT: 38.2 % (ref 36.0–46.0)
Hemoglobin: 12.3 g/dL (ref 12.0–15.0)
LYMPHS PCT: 19 % (ref 12–46)
Lymphs Abs: 1 10*3/uL (ref 0.7–4.0)
MCH: 31 pg (ref 26.0–34.0)
MCHC: 32.2 g/dL (ref 30.0–36.0)
MCV: 96.2 fL (ref 78.0–100.0)
MONO ABS: 1.2 10*3/uL — AB (ref 0.1–1.0)
MONOS PCT: 23 % — AB (ref 3–12)
NEUTROS ABS: 3 10*3/uL (ref 1.7–7.7)
Neutrophils Relative %: 57 % (ref 43–77)
Platelets: 141 10*3/uL — ABNORMAL LOW (ref 150–400)
RBC: 3.97 MIL/uL (ref 3.87–5.11)
RDW: 14.9 % (ref 11.5–15.5)
WBC: 5.3 10*3/uL (ref 4.0–10.5)

## 2014-05-09 LAB — COMPREHENSIVE METABOLIC PANEL
ALT: 10 U/L (ref 0–35)
AST: 24 U/L (ref 0–37)
Albumin: 3.8 g/dL (ref 3.5–5.2)
Alkaline Phosphatase: 66 U/L (ref 39–117)
Anion gap: 7 (ref 5–15)
BUN: 29 mg/dL — ABNORMAL HIGH (ref 6–23)
CALCIUM: 9 mg/dL (ref 8.4–10.5)
CO2: 27 mmol/L (ref 19–32)
Chloride: 104 mmol/L (ref 96–112)
Creatinine, Ser: 1.3 mg/dL — ABNORMAL HIGH (ref 0.50–1.10)
GFR calc Af Amer: 42 mL/min — ABNORMAL LOW (ref >90)
GFR calc non Af Amer: 36 mL/min — ABNORMAL LOW (ref >90)
Glucose, Bld: 115 mg/dL — ABNORMAL HIGH (ref 70–99)
POTASSIUM: 4.2 mmol/L (ref 3.5–5.1)
SODIUM: 138 mmol/L (ref 135–145)
TOTAL PROTEIN: 6.4 g/dL (ref 6.0–8.3)
Total Bilirubin: 0.7 mg/dL (ref 0.3–1.2)

## 2014-05-09 LAB — INFLUENZA PANEL BY PCR (TYPE A & B)
H1N1FLUPCR: NOT DETECTED
INFLAPCR: NEGATIVE
Influenza B By PCR: NEGATIVE

## 2014-05-09 LAB — BRAIN NATRIURETIC PEPTIDE: B NATRIURETIC PEPTIDE 5: 457.4 pg/mL — AB (ref 0.0–100.0)

## 2014-05-09 LAB — LACTIC ACID, PLASMA: Lactic Acid, Venous: 0.8 mmol/L (ref 0.5–2.0)

## 2014-05-09 MED ORDER — SODIUM CHLORIDE 0.9 % IV SOLN: INTRAVENOUS | Status: DC

## 2014-05-09 MED ORDER — METOPROLOL SUCCINATE ER 25 MG PO TB24
25.0000 mg | ORAL_TABLET | Freq: Every day | ORAL | Status: DC
  Administered 2014-05-09 – 2014-05-12 (×4): 25 mg via ORAL
  Filled 2014-05-09 (×4): qty 1

## 2014-05-09 MED ORDER — MEMANTINE HCL ER 28 MG PO CP24
28.0000 mg | ORAL_CAPSULE | Freq: Every day | ORAL | Status: DC
  Administered 2014-05-09 – 2014-05-12 (×4): 28 mg via ORAL
  Filled 2014-05-09 (×4): qty 1

## 2014-05-09 MED ORDER — ONDANSETRON HCL 4 MG PO TABS: 4.0000 mg | ORAL_TABLET | Freq: Four times a day (QID) | ORAL | Status: DC | PRN

## 2014-05-09 MED ORDER — VANCOMYCIN HCL IN DEXTROSE 1-5 GM/200ML-% IV SOLN
1000.0000 mg | Freq: Every day | INTRAVENOUS | Status: DC
  Administered 2014-05-09 (×2): 1000 mg via INTRAVENOUS
  Filled 2014-05-09 (×3): qty 200

## 2014-05-09 MED ORDER — DEXTROSE 5 % IV SOLN
1.0000 g | INTRAVENOUS | Status: DC
  Administered 2014-05-09: 1 g via INTRAVENOUS
  Filled 2014-05-09 (×2): qty 1

## 2014-05-09 MED ORDER — ONDANSETRON HCL 4 MG/2ML IJ SOLN: 4.0000 mg | Freq: Four times a day (QID) | INTRAMUSCULAR | Status: DC | PRN

## 2014-05-09 MED ORDER — ACETAMINOPHEN 650 MG RE SUPP: 650.0000 mg | Freq: Four times a day (QID) | RECTAL | Status: DC | PRN

## 2014-05-09 MED ORDER — DABIGATRAN ETEXILATE MESYLATE 75 MG PO CAPS
75.0000 mg | ORAL_CAPSULE | Freq: Two times a day (BID) | ORAL | Status: DC
  Administered 2014-05-09 – 2014-05-12 (×8): 75 mg via ORAL
  Filled 2014-05-09 (×9): qty 1

## 2014-05-09 MED ORDER — CETYLPYRIDINIUM CHLORIDE 0.05 % MT LIQD
7.0000 mL | Freq: Two times a day (BID) | OROMUCOSAL | Status: DC
  Administered 2014-05-09 – 2014-05-12 (×7): 7 mL via OROMUCOSAL

## 2014-05-09 MED ORDER — DILTIAZEM HCL ER 180 MG PO CP24
180.0000 mg | ORAL_CAPSULE | Freq: Every day | ORAL | Status: DC
  Administered 2014-05-09 – 2014-05-12 (×4): 180 mg via ORAL
  Filled 2014-05-09 (×4): qty 1

## 2014-05-09 MED ORDER — HYDRALAZINE HCL 50 MG PO TABS
100.0000 mg | ORAL_TABLET | Freq: Two times a day (BID) | ORAL | Status: DC
  Administered 2014-05-09 – 2014-05-12 (×8): 100 mg via ORAL
  Filled 2014-05-09 (×9): qty 2

## 2014-05-09 MED ORDER — DOCUSATE SODIUM 100 MG PO CAPS
100.0000 mg | ORAL_CAPSULE | Freq: Two times a day (BID) | ORAL | Status: DC
  Administered 2014-05-09 – 2014-05-12 (×8): 100 mg via ORAL
  Filled 2014-05-09 (×8): qty 1

## 2014-05-09 MED ORDER — DARIFENACIN HYDROBROMIDE ER 7.5 MG PO TB24
7.5000 mg | ORAL_TABLET | Freq: Every day | ORAL | Status: DC
  Administered 2014-05-09 – 2014-05-12 (×4): 7.5 mg via ORAL
  Filled 2014-05-09 (×4): qty 1

## 2014-05-09 MED ORDER — ACETAMINOPHEN 325 MG PO TABS: 650.0000 mg | ORAL_TABLET | Freq: Four times a day (QID) | ORAL | Status: DC | PRN

## 2014-05-09 MED ORDER — ENSURE COMPLETE PO LIQD
237.0000 mL | Freq: Two times a day (BID) | ORAL | Status: DC
  Administered 2014-05-09 – 2014-05-12 (×5): 237 mL via ORAL

## 2014-05-09 NOTE — Progress Notes (Signed)
PROGRESS NOTE  TEXANNA HILBURN ZOX:[id number] DOB: [birth year] DOA: 05/08/2014 PCP: Zoila Shutter, MD  HPI/Subjective: Natika Geyer is an 81 year old female with a past medical history of dementia, HTN, previous CVA, CAD, and afib presented to the [facility] ED on 05/08/14 with increased confusion, drowsiness, weakness, and dry cough. Patient lives with her daughter who has seen a decline over the last week. Patient was previously admitted for possible pneumonia, AKI, and constipation 3 weeks ago.   In the ED, patient was febrile with a temp of 100.4. CXR showed bilateral opacities- pneumonia vs edema. Abdominal radiograph showed moderate amount of stool but no other abnormalities. Her creatinine was 1.4 which was improved since previous admission. She did not have leukocytosis as WBC was 7.4. UA showed no evidence of infection.  Today, Mrs. Hanssen seems to be improving. She is oriented to person, but not place and time. She reports no complaints. She is tachycardic at 113bpm but afebrile. She was able to stand with assistance and move to the chair, however, she does not seem capable of this without help. She is able to eat/swallow a regular diet but requires encouragement and some assistance with feeding herself at this time. I spoke with Mrs. Cummings daughter, [name], and she stated that her mother is generally able to ambulate unassisted and feed herself without difficulty but is generally confused and does not know time/place. Mrs. Mutz lives with her daughter who is the primary care giver.   Assessment/Plan:   HCAP (healthcare-associated pneumonia) -Patient was hospitalized 3 weeks ago for pneumonia, presented with history of cough and CXR suggestive of pneumonia -Started on vancomycin and cefepime -Patient is afebrile today (last temperature 99.1) and has WBC of 5.3 -Currently has oxygen saturation of 97% on 2L nasal cannula  -Influenza panel was negative -Continue  antibiotics and wean supplemental oxygen as tolerated by patient.     Chronic renal disease, stage 3, moderately decreased glomerular filtration rate (GFR) between 30-59 mL/min/1.73 square meter -Creatinine on admission was 1.4 with estimated GFR of 30 -Was given 1L of fluid on admission -Improved today- creatinine 1.3 and estimated GFR of 36 -Monitor with daily BMP    Dementia -Patient appears to be close to baseline according to daughter - Continue home medication of Namenda  daily    Weakness -Likely secondary to pneumonia  -CT head showed no acute abnormalities; chronic ischemia and atrophy -PT evaluated and indicates ambulation with assistance- home is family can provide 24/7 assistance otherwise SNF. Daughter indicated that she was interested in assistance at home but will require further discussion. -SW consult    Chronic atrial fibrillation - Patient is anticoagulated with Pradaxa -Currently on Toprol-XL and Dilacor XR for rate control -Patient was tachycardic this morning with pulse of 113 -Will re-evaluate and potentially increase dosage    Hypertension -Stable with last reading 127/58 -Continue Dilacor, hydralazine, and Toprol    DVT Prophylaxis:  On Pradaxa   Code Status: Full  Family Communication: Daughter Ms. Neale Burly on phone Disposition Plan: Remain inpatient   Consultants:  Physical therapy  Procedures:  None  Antibiotics: Anti-infectives    Start     Dose/Rate Route Frequency Ordered Stop   05/09/14 2000  ceFEPIme (MAXIPIME) 1 g in dextrose 5 % 50 mL IVPB     1 g 100 mL/hr over 30 Minutes Intravenous Every 24 hours 05/09/14 0321     05/09/14 0100  vancomycin (VANCOCIN) IVPB 1000 mg/200 mL premix  1,000 mg 200 mL/hr over 60 Minutes Intravenous Daily at bedtime 05/09/14 0009     05/09/14 0000  ceFEPIme (MAXIPIME) 1 g in dextrose 5 % 50 mL IVPB     1 g 100 mL/hr over 30 Minutes Intravenous  Once 05/08/14 2353 05/09/14 0115       Objective: Filed Vitals:   05/09/14 0230 05/09/14 0300 05/09/14 0547 05/09/14 1008  BP: 134/58 148/65 127/58 118/58  Pulse: 86 93 113 120  Temp:  98.5 F (36.9 C) 99.1 F (37.3 C)   TempSrc:  Oral Oral   Resp: 19 18 18    Weight:  68.221 kg (150 lb 6.4 oz)    SpO2: 98% 97% 97%     Intake/Output Summary (Last 24 hours) at 05/09/14 1020 Last data filed at 05/09/14 0600  Gross per 24 hour  Intake    250 ml  Output      0 ml  Net    250 ml   Filed Weights   05/09/14 0300  Weight: 68.221 kg (150 lb 6.4 oz)    Exam: General: Well developed, well nourished, appears frail but in no distress, appears stated age  HEENT:  PERR, Anicteic Sclera, MMM. No pharyngeal erythema or exudates  Neck: Supple, no JVD, no masses  Cardiovascular: RRR, S1 S2 auscultated, no rubs, murmurs or gallops.   Respiratory: Clear to auscultation bilaterally with equal chest rise- hard to assess because patient confused and unable to take deep breaths on command  Abdomen: Soft, nontender, nondistended, + bowel sounds  Extremities: warm dry without cyanosis clubbing or edema.  Neuro: Alert- oriented to person but not place and time, cranial nerves grossly intact. Skin: Without rashes exudates or nodules.    Data Reviewed: Basic Metabolic Panel:  Recent Labs Lab 05/08/14 2046 05/09/14 0445  NA 139 138  K 4.4 4.2  CL 106 104  CO2  --  27  GLUCOSE 112* 115*  BUN 33* 29*  CREATININE 1.40* 1.30*  CALCIUM  --  9.0   Liver Function Tests:  Recent Labs Lab 05/09/14 0445  AST 24  ALT 10  ALKPHOS 66  BILITOT 0.7  PROT 6.4  ALBUMIN 3.8   CBC:  Recent Labs Lab 05/08/14 2046 05/09/14 0445  WBC 7.4 5.3  NEUTROABS 5.1 3.0  HGB 13.4  14.6 12.3  HCT 42.2  43.0 38.2  MCV 96.8 96.2  PLT 170 141*   BNP (last 3 results)  Recent Labs  05/08/14 2253  BNP 457.4*    ProBNP (last 3 results)  Recent Labs  11/18/13 2157  PROBNP 1073.0*   Studies: Dg Chest 2 View  05/08/2014    CLINICAL DATA:  Acute onset of generalized weakness. Initial encounter.  EXAM: CHEST  2 VIEW  COMPARISON:  Chest radiograph performed 04/15/2014  FINDINGS: The lungs are well-aerated. Mild patchy bilateral airspace opacities may reflect mild pneumonia or interstitial edema, with underlying vascular congestion. No pleural effusion or pneumothorax is seen.  The heart is mildly enlarged. A relatively large hiatal hernia is suggested. No acute osseous abnormalities are seen. Postoperative change is seen overlying the right axilla.  IMPRESSION: 1. Mild patchy bilateral airspace opacities may reflect mild pneumonia or interstitial edema, with underlying vascular congestion. 2. Mild cardiomegaly. 3. Large hiatal hernia again noted.   Electronically Signed   By: Roanna RaiderJeffery  Chang M.D.   On: 05/08/2014 21:52   Ct Head Wo Contrast  05/09/2014   CLINICAL DATA:  Confusion.  History of Alzheimer's disease  EXAM:  CT HEAD WITHOUT CONTRAST  TECHNIQUE: Contiguous axial images were obtained from the base of the skull through the vertex without intravenous contrast.  COMPARISON:  11/11/2012  FINDINGS: Skull and Sinuses:Negative for fracture or destructive process. The mastoids, middle ears, and imaged paranasal sinuses are clear.  Orbits: No acute abnormality.  Brain: No evidence of acute infarction, hemorrhage, hydrocephalus, or mass lesion/mass effect. Brain atrophy correlating with history of dementia. There is chronic small vessel disease with patchy cerebral white matter ischemic gliosis. Remote lateral lenticulostriate infarct on the right, primarily affecting the caudate head and anterior putamen.  IMPRESSION: 1. No acute intracranial findings. 2. Atrophy and chronic ischemia, as above.   Electronically Signed   By: Marnee Spring M.D.   On: 05/09/2014 02:54   Dg Abd 2 Views  05/08/2014   CLINICAL DATA:  Acute onset of abdominal distention and constipation for 2 days. Initial encounter.  EXAM: ABDOMEN - 2 VIEW  COMPARISON:   Abdominal radiograph performed 04/18/2014  FINDINGS: The visualized bowel gas pattern is unremarkable. Scattered air and stool filled loops of colon are seen; no abnormal dilatation of small bowel loops is seen to suggest small bowel obstruction. No free intra-abdominal air is identified on the provided decubitus view. The rectum is largely filled with air.  The visualized osseous structures are within normal limits; the sacroiliac joints are unremarkable in appearance. The visualized lung bases are essentially clear.  IMPRESSION: Unremarkable bowel gas pattern; no free intra-abdominal air seen. Moderate amount of stool noted in the colon. The rectum is largely filled with air.   Electronically Signed   By: Roanna Raider M.D.   On: 05/08/2014 21:54    Scheduled Meds: . antiseptic oral rinse  7 mL Mouth Rinse BID  . ceFEPime (MAXIPIME) IV  1 g Intravenous Q24H  . dabigatran  75 mg Oral Q12H  . darifenacin  7.5 mg Oral Daily  . diltiazem  180 mg Oral Daily  . docusate sodium  100 mg Oral BID  . feeding supplement (ENSURE COMPLETE)  237 mL Oral BID BM  . hydrALAZINE  100 mg Oral BID  . memantine  28 mg Oral Daily  . metoprolol succinate  25 mg Oral Daily  . vancomycin  1,000 mg Intravenous QHS   Continuous Infusions:   Principal Problem:   HCAP (healthcare-associated pneumonia) Active Problems:   Chronic renal disease, stage 3, moderately decreased glomerular filtration rate (GFR) between 30-59 mL/min/1.73 square meter   Dementia   Weakness   Chronic atrial fibrillation   Hypertension   Pneumonia    Raspect, Erin, PA-S [facility] 05/09/2014, 10:20 AM    Addendum  I personally saw and evaluated patient on 05/09/2014 and agree with the above findings. She is a pleasant 81 year old female with a past medical history of dementia, currently residing at home with family, brought to the emergency department after having a steep functional decline over the past several days. Family  was reporting patient at baseline able to ambulate and feed himself, however over the past several days she has been minimally active and refusing by mouth intake. Lab work revealed the presence of pneumonia, likely HCAP from recent hospitalization. She was started on broad-spectrum IV antimicrobial therapy. She was assisted out of bed to chair today, PT consultation placed.

## 2014-05-09 NOTE — H&P (Addendum)
[facility] History and Physical  Carolyn PaulaMarleen A Gastineau EAV:[id number] DOB: 02/09/1926 DOA: 05/08/2014  Referring physician: ER physician. PCP: Zoila ShutterWOODYEAR,WYNNE E, MD   History obtained from patient's daughter.  Chief Complaint: Weakness and confusion.  HPI: Carolyn Sanders is a 81 y.o. female with history of dementia, chronic atrial fibrillation, hypertension and chronic kidney disease who was recently admitted for obstipation was brought to the ER after patient was found to be increasingly confused and weak and difficulty to walk. In the ER patient was found to be nonfocal and lethargic. Patient had a fever of 100.37F. Chest x-ray was showing possible infiltrates versus edema. Patient's daughter states that patient has been having cough for last 1 week. Denies any nausea vomiting abdominal pain but did have one episode of diarrhea last week. UA is unremarkable. Patient will be admitted for encephalopathy most likely from fever secondary to pneumonia.   Review of Systems: As presented in the history of presenting illness, rest negative.  Past Medical History  Diagnosis Date  . Stroke   . Coronary artery disease   . Atrial fibrillation   . Hypertension   . Dementia    History reviewed. No pertinent past surgical history. Social History:  reports that she has never smoked. She has never used smokeless tobacco. She reports that she does not drink alcohol or use illicit drugs. Where does patient live home. Can patient participate in ADLs? No.  No Known Allergies  Family History:  Family History  Problem Relation Age of Onset  . Breast cancer Mother       Prior to Admission medications   Medication Sig Start Date End Date Taking? Authorizing Provider  Calcium Carbonate-Vit D-Min (CALCIUM 600+D PLUS MINERALS) 600-400 MG-UNIT TABS Take 1 tablet by mouth daily.   Yes Historical Provider, MD  dabigatran (PRADAXA) 75 MG CAPS Take 75 mg by mouth every 12 (twelve) hours.     Yes  Historical Provider, MD  diltiazem (DILACOR XR) 180 MG 24 hr capsule Take 180 mg by mouth daily.   Yes Historical Provider, MD  docusate sodium (COLACE) 100 MG capsule Take 100 mg by mouth 2 (two) times daily.   Yes Historical Provider, MD  hydrALAZINE (APRESOLINE) 50 MG tablet Take 100 mg by mouth 2 (two) times daily.   Yes Historical Provider, MD  memantine (NAMENDA XR) 28 MG CP24 24 hr capsule Take 28 mg by mouth daily.   Yes Historical Provider, MD  metoprolol succinate (TOPROL-XL) 25 MG 24 hr tablet Take 25 mg by mouth daily.   Yes Historical Provider, MD  Multiple Vitamin (MULTIVITAMIN WITH MINERALS) TABS tablet Take 1 tablet by mouth daily.   Yes Historical Provider, MD  solifenacin (VESICARE) 10 MG tablet Take 10 mg by mouth daily.   Yes Historical Provider, MD  bisacodyl (DULCOLAX) 10 MG suppository Place 1 suppository (10 mg total) rectally daily as needed for moderate constipation. Patient not taking: Reported on 05/08/2014 04/18/14   Calvert CantorSaima Rizwan, MD  polyethylene glycol (MIRALAX / GLYCOLAX) packet Take 17 g by mouth 2 (two) times daily. Patient not taking: Reported on 05/08/2014 04/18/14   Calvert CantorSaima Rizwan, MD  senna-docusate (SENOKOT-S) 8.6-50 MG per tablet Take 1 tablet by mouth at bedtime. Patient not taking: Reported on 05/08/2014 04/18/14   Calvert CantorSaima Rizwan, MD    Physical Exam: Filed Vitals:   05/08/14 1929 05/08/14 2106 05/08/14 2250 05/09/14 0030  BP: 138/48 131/63 139/68 125/52  Pulse: 78 81 88 80  Temp: 100.4 F (38 C)  TempSrc: Oral     Resp: SpO2: 94% 99% 97% 97%     General:  Well-developed and nourished.  Eyes: Anicteric. No pallor.  ENT: No discharge from the ears eyes nose and mouth.  Neck: No mass felt. No neck rigidity.  Cardiovascular: S1-S2 heard.  Respiratory: No rhonchi or crepitations.  Abdomen: Soft nontender bowel sounds present.  Skin: No rash.  Musculoskeletal: No edema.  Psychiatric: Patient is oriented to her name.  Neurologic:  Alert awake and oriented to his name. Moves all extremities.  Labs on Admission:  Basic Metabolic Panel:  Recent Labs Lab 05/08/14 2046  NA 139  K 4.4  CL 106  GLUCOSE 112*  BUN 33*  CREATININE 1.40*   Liver Function Tests: No results for input(s): AST, ALT, ALKPHOS, BILITOT, PROT, ALBUMIN in the last 168 hours. No results for input(s): LIPASE, AMYLASE in the last 168 hours. No results for input(s): AMMONIA in the last 168 hours. CBC:  Recent Labs Lab 05/08/14 2046  WBC 7.4  NEUTROABS 5.1  HGB 13.4  14.6  HCT 42.2  43.0  MCV 96.8  PLT 170   Cardiac Enzymes: No results for input(s): CKTOTAL, CKMB, CKMBINDEX, TROPONINI in the last 168 hours.  BNP (last 3 results)  Recent Labs  05/08/14 2253  BNP 457.4*    ProBNP (last 3 results)  Recent Labs  11/18/13 2157  PROBNP 1073.0*    CBG: No results for input(s): GLUCAP in the last 168 hours.  Radiological Exams on Admission: Dg Chest 2 View  05/08/2014   CLINICAL DATA:  Acute onset of generalized weakness. Initial encounter.  EXAM: CHEST  2 VIEW  COMPARISON:  Chest radiograph performed 04/15/2014  FINDINGS: The lungs are well-aerated. Mild patchy bilateral airspace opacities may reflect mild pneumonia or interstitial edema, with underlying vascular congestion. No pleural effusion or pneumothorax is seen.  The heart is mildly enlarged. A relatively large hiatal hernia is suggested. No acute osseous abnormalities are seen. Postoperative change is seen overlying the right axilla.  IMPRESSION: 1. Mild patchy bilateral airspace opacities may reflect mild pneumonia or interstitial edema, with underlying vascular congestion. 2. Mild cardiomegaly. 3. Large hiatal hernia again noted.   Electronically Signed   By: Roanna Raider M.D.   On: 05/08/2014 21:52   Dg Abd 2 Views  05/08/2014   CLINICAL DATA:  Acute onset of abdominal distention and constipation for 2 days. Initial encounter.  EXAM: ABDOMEN - 2 VIEW  COMPARISON:   Abdominal radiograph performed 04/18/2014  FINDINGS: The visualized bowel gas pattern is unremarkable. Scattered air and stool filled loops of colon are seen; no abnormal dilatation of small bowel loops is seen to suggest small bowel obstruction. No free intra-abdominal air is identified on the provided decubitus view. The rectum is largely filled with air.  The visualized osseous structures are within normal limits; the sacroiliac joints are unremarkable in appearance. The visualized lung bases are essentially clear.  IMPRESSION: Unremarkable bowel gas pattern; no free intra-abdominal air seen. Moderate amount of stool noted in the colon. The rectum is largely filled with air.   Electronically Signed   By: Roanna Raider M.D.   On: 05/08/2014 21:54    EKG: Independently reviewed. Atrial fibrillation controlled rate.  Assessment/Plan Principal Problem:   HCAP (healthcare-associated pneumonia) Active Problems:   Chronic renal disease, stage 3, moderately decreased glomerular filtration rate (GFR) between 30-59 mL/min/1.73 square meter   Dementia   Weakness   Chronic atrial fibrillation  Hypertension   Pneumonia   1. Healthcare associated pneumonia - patient has been placed on vancomycin and cefepime. Positive follow fever pattern and respiratory status. 2. Weakness and lethargy - most likely related to fever probably secondary to pneumonia. Get physical therapy consult. CT head is pending. 3. Chronic atrial fibrillation - chads 2 Vasc score is 4. Patient is on Pradaxa for anticoagulation. Presently rate controlled. Patient is on beta blocker and calcium channel blocker. 4. Hypertension - continue present medications. 5. Chronic kidney disease stage III - follow metabolic panel. 6. Dementia - on Namenda.   DVT Prophylaxis on Pradaxa.  Code Status: Full code.  Family Communication: Patient's daughter.  Disposition Plan: Admit to inpatient.    Quentyn Kolbeck N. [facility] Pager [phone].  If 7PM-7AM, please contact night-coverage [email or web address] Password [email or web address] 05/09/2014, 2:09 AM

## 2014-05-09 NOTE — Progress Notes (Addendum)
ANTIBIOTIC CONSULT NOTE - INITIAL  Pharmacy Consult for Vancomycin/Cefepime Indication: HCAP  No Known Allergies  Patient Measurements:   Wt=69 kg  Vital Signs: Temp: 100.4 F (38 C) (03/01 1929) Temp Source: Oral (03/01 1929) BP: 125/52 mmHg (03/02 0030) Pulse Rate: 80 (03/02 0030) Intake/Output from previous day:   Intake/Output from this shift:    Labs:  Recent Labs  05/08/14 2046  WBC 7.4  HGB 13.4  14.6  PLT 170  CREATININE 1.40*   CrCl cannot be calculated (Unknown ideal weight.). No results for input(s): VANCOTROUGH, VANCOPEAK, VANCORANDOM, GENTTROUGH, GENTPEAK, GENTRANDOM, TOBRATROUGH, TOBRAPEAK, TOBRARND, AMIKACINPEAK, AMIKACINTROU, AMIKACIN in the last 72 hours.   Microbiology: No results found for this or any previous visit (from the past 720 hour(s)).  Medical History: Past Medical History  Diagnosis Date  . Stroke   . Coronary artery disease   . Atrial fibrillation   . Hypertension   . Dementia     Medications:  Scheduled:   Infusions:  . vancomycin 1,000 mg (05/09/14 0129)   Assessment: 88 yoF c/o weakness, cough, fatigue. CXR suggestive of PNA. Vancomycin per Rx for PNA.  3/2>>cefepime >> 3/2 >>vancomycin >>  Goal of Therapy:  Vancomycin trough level 15-20 mcg/ml  Plan:   Vancomycin 1Gm IV q24h   Cefepime 1Gm IV q24h  F/u Scr/cultures/levels  Susanne GreenhouseGreen, Kharee Lesesne R 05/09/2014,1:40 AM

## 2014-05-09 NOTE — Plan of Care (Signed)
Problem: Phase I Progression Outcomes Goal: OOB as tolerated unless otherwise ordered Outcome: Progressing PT walked with the patient,had used front wheel walker

## 2014-05-09 NOTE — Progress Notes (Signed)
INITIAL NUTRITION ASSESSMENT  DOCUMENTATION CODES Per approved criteria  -Not Applicable   INTERVENTION: - Ensure Complete po BID, each supplement provides 350 kcal and 13 grams of protein - RD will continue to monitor  NUTRITION DIAGNOSIS: Inadequate oral intake related to dementia as evidenced by poor po.   Goal: Pt to meet >/= 90% of their estimated nutrition needs   Monitor:  Weight trend, po intake, acceptance of supplements, labs  Reason for Assessment: Malnutrition Screening Tool  81 y.o. female  Admitting Dx: HCAP (healthcare-associated pneumonia)  ASSESSMENT: 81 y.o. female with history of dementia, chronic atrial fibrillation, hypertension and chronic kidney disease who was recently admitted for obstipation was brought to the ER after patient was found to be increasingly confused and weak and difficulty to walk. In the ER patient was found to be nonfocal and lethargic. Patient had a fever of 100.15F. Chest x-ray was showing possible infiltrates versus edema. Patient's daughter states that patient has been having cough for last 1 week. Denies any nausea vomiting abdominal pain but did have one episode of diarrhea last week. UA is unremarkable. Patient will be admitted for encephalopathy most likely from fever secondary to pneumonia.   - Pt's wt stable per chart history. - Pt asleep during RD visit. Nutritional history obtained from chart and RN.  - Per RN, pt is eating <50% of meals and she drank a little Ensure this morning.  - Will order nutritional supplements to improve PO intake.  - No signs of fat or muscle wasting. - Labs reviewed.   Height: Ht Readings from Last 1 Encounters:  04/15/14 5\' 1"  (1.549 m)    Weight: Wt Readings from Last 1 Encounters:  05/09/14 150 lb 6.4 oz (68.221 kg)    Ideal Body Weight: 47.8 kg  % Ideal Body Weight: 143%  Wt Readings from Last 10 Encounters:  05/09/14 150 lb 6.4 oz (68.221 kg)  04/15/14 152 lb 12.5 oz (69.3 kg)     BMI:  Body mass index is 28.43 kg/(m^2).  Estimated Nutritional Needs: Kcal: 1600-1800 Protein: 90-100 g Fluid: 1.7 L/day  Skin: intact  Diet Order: Diet Heart  EDUCATION NEEDS: -No education needs identified at this time   Intake/Output Summary (Last 24 hours) at 05/09/14 1130 Last data filed at 05/09/14 0600  Gross per 24 hour  Intake    250 ml  Output      0 ml  Net    250 ml    Last BM: prior to admission   Labs:   Recent Labs Lab 05/08/14 2046 05/09/14 0445  NA 139 138  K 4.4 4.2  CL 106 104  CO2  --  27  BUN 33* 29*  CREATININE 1.40* 1.30*  CALCIUM  --  9.0  GLUCOSE 112* 115*    CBG (last 3)  No results for input(s): GLUCAP in the last 72 hours.  Scheduled Meds: . antiseptic oral rinse  7 mL Mouth Rinse BID  . ceFEPime (MAXIPIME) IV  1 g Intravenous Q24H  . dabigatran  75 mg Oral Q12H  . darifenacin  7.5 mg Oral Daily  . diltiazem  180 mg Oral Daily  . docusate sodium  100 mg Oral BID  . feeding supplement (ENSURE COMPLETE)  237 mL Oral BID BM  . hydrALAZINE  100 mg Oral BID  . memantine  28 mg Oral Daily  . metoprolol succinate  25 mg Oral Daily  . vancomycin  1,000 mg Intravenous QHS    Continuous Infusions:  Past Medical History  Diagnosis Date  . Stroke   . Coronary artery disease   . Atrial fibrillation   . Hypertension   . Dementia     History reviewed. No pertinent past surgical history.  Emmaline Kluver MS, RD, LDN [phone]

## 2014-05-09 NOTE — Evaluation (Signed)
Physical Therapy Evaluation Patient Details Name: Carolyn Sanders MRN: [id number] DOB: Sep 20, 1925 Today's Date: 05/09/2014   History of Present Illness  81 y.o. female with history of dementia, chronic atrial fibrillation, hypertension and chronic kidney disease who was recently admitted for constipation was brought to the ER after patient was found to be increasingly confused and weak and difficulty to walk; admitted for HCAP and weakness.  Clinical Impression  Pt admitted with above diagnosis. Pt currently with functional limitations due to the deficits listed below (see PT Problem List).  Pt will benefit from skilled PT to increase their independence and safety with mobility to allow discharge to the venue listed below.  Pt able to tolerate short distance ambulation with at least min assist at this time for steadying and cues.  Pt remained on 2L O2  throughout session.  No family present, and pt with hx of dementia only orientated to self.  If family unable to provide current assist level 24/7 then recommend SNF.     Follow Up Recommendations Home health PT;Supervision/Assistance - 24 hour (if family cannot provide assist, 24/7 care, recommend SNF)    Equipment Recommendations  None recommended by PT    Recommendations for Other Services       Precautions / Restrictions Precautions Precautions: Fall Precaution Comments: incontinence Restrictions Weight Bearing Restrictions: No      Mobility  Bed Mobility Overal bed mobility: Needs Assistance Bed Mobility: Sit to Supine       Sit to supine: Min assist   General bed mobility comments: verbal and visual cues for technique, assist for feet onto bed  Transfers Overall transfer level: Needs assistance Equipment used: Rolling walker (2 wheeled) Transfers: Sit to/from Stand Sit to Stand: Min assist         General transfer comment: verbal cues for technique and using UEs to assist, assist for rise and steadying as well  as controlling descent, performed a couple times due to pt stating she needed to urniate and then had incontinent episode   Ambulation/Gait Ambulation/Gait assistance: Min assist Ambulation Distance (Feet): 40 Feet Assistive device: Rolling walker (2 wheeled) Gait Pattern/deviations: Step-through pattern;Decreased stride length;Shuffle;Narrow base of support     General Gait Details: verbal cues for safety and avoiding objects, assist to steady occasionally  Stairs            Wheelchair Mobility    Modified Rankin (Stroke Patients Only)       Balance Overall balance assessment: Needs assistance         Standing balance support: Bilateral upper extremity supported Standing balance-Leahy Scale: Poor Standing balance comment: requirest RW and assist to steadying in static standing                             Pertinent Vitals/Pain Pain Assessment: No/denies pain    Home Living Family/patient expects to be discharged to:: Private residence Living Arrangements: Children             Home Equipment: Dan Humphreys - 2 wheels;Wheelchair - manual;Cane - single point Additional Comments: hx of dementia, no family present, per chart review lives with daughter and has RW and [facility] however typically doesn't use, also uses oxygen at home (2L however not clear if at night/just supine/? per notes in chart)    Prior Function Level of Independence: Needs assistance   Gait / Transfers Assistance Needed: likely at least supervision     Comments: uncertain  Hand Dominance        Extremity/Trunk Assessment               Lower Extremity Assessment: Generalized weakness         Communication   Communication: HOH  Cognition Arousal/Alertness: Awake/alert Behavior During Therapy: WFL for tasks assessed/performed Overall Cognitive Status: History of cognitive impairments - at baseline (hx dementia, able to follow commands)                       General Comments      Exercises        Assessment/Plan    PT Assessment Patient needs continued PT services  PT Diagnosis Difficulty walking;Generalized weakness   PT Problem List Decreased strength;Decreased activity tolerance;Decreased mobility;Decreased balance  PT Treatment Interventions DME instruction;Gait training;Functional mobility training;Therapeutic activities;Therapeutic exercise;Patient/family education   PT Goals (Current goals can be found in the Care Plan section) Acute Rehab PT Goals PT Goal Formulation: Patient unable to participate in goal setting Time For Goal Achievement: 05/23/14 Potential to Achieve Goals: Good    Frequency Min 3X/week   Barriers to discharge        Co-evaluation               End of Session Equipment Utilized During Treatment: Gait belt;Oxygen Activity Tolerance: Patient tolerated treatment well Patient left: in bed;with call bell/phone within reach;with bed alarm set           Time: 1006-1036 PT Time Calculation (min) (ACUTE ONLY): 30 min   Charges:   PT Evaluation $Initial PT Evaluation Tier I: 1 Procedure PT Treatments $Gait Training: 8-22 mins   PT G Codes:        Elray Dains,KATHrine E 05/09/2014, 11:02 AM Zenovia JarredKati Bruk [place], PT, DPT 05/09/2014 Pager: [phone]

## 2014-05-09 NOTE — Progress Notes (Signed)
Pt's daughter at bedside. Pt sleeping.

## 2014-05-10 DIAGNOSIS — R531 Weakness: Secondary | ICD-10-CM

## 2014-05-10 DIAGNOSIS — R627 Adult failure to thrive: Secondary | ICD-10-CM

## 2014-05-10 DIAGNOSIS — I10 Essential (primary) hypertension: Secondary | ICD-10-CM

## 2014-05-10 LAB — CBC
HCT: 37.3 % (ref 36.0–46.0)
Hemoglobin: 12 g/dL (ref 12.0–15.0)
MCH: 31 pg (ref 26.0–34.0)
MCHC: 32.2 g/dL (ref 30.0–36.0)
MCV: 96.4 fL (ref 78.0–100.0)
Platelets: 161 10*3/uL (ref 150–400)
RBC: 3.87 MIL/uL (ref 3.87–5.11)
RDW: 15.3 % (ref 11.5–15.5)
WBC: 7.1 10*3/uL (ref 4.0–10.5)

## 2014-05-10 LAB — BASIC METABOLIC PANEL
ANION GAP: 6 (ref 5–15)
BUN: 32 mg/dL — ABNORMAL HIGH (ref 6–23)
CALCIUM: 8.1 mg/dL — AB (ref 8.4–10.5)
CO2: 24 mmol/L (ref 19–32)
CREATININE: 1.48 mg/dL — AB (ref 0.50–1.10)
Chloride: 103 mmol/L (ref 96–112)
GFR, EST AFRICAN AMERICAN: 36 mL/min — AB (ref >90)
GFR, EST NON AFRICAN AMERICAN: 31 mL/min — AB (ref >90)
Glucose, Bld: 105 mg/dL — ABNORMAL HIGH (ref 70–99)
Potassium: 3.9 mmol/L (ref 3.5–5.1)
Sodium: 133 mmol/L — ABNORMAL LOW (ref 135–145)

## 2014-05-10 MED ORDER — BISACODYL 10 MG RE SUPP
10.0000 mg | Freq: Once | RECTAL | Status: AC
  Administered 2014-05-10: 10 mg via RECTAL
  Filled 2014-05-10: qty 1

## 2014-05-10 MED ORDER — CEFUROXIME AXETIL 500 MG PO TABS
500.0000 mg | ORAL_TABLET | Freq: Two times a day (BID) | ORAL | Status: DC
  Administered 2014-05-10 – 2014-05-12 (×4): 500 mg via ORAL
  Filled 2014-05-10 (×7): qty 1

## 2014-05-10 MED ORDER — SODIUM CHLORIDE 0.9 % IV BOLUS (SEPSIS)
500.0000 mL | Freq: Once | INTRAVENOUS | Status: AC
  Administered 2014-05-10: 500 mL via INTRAVENOUS

## 2014-05-10 MED ORDER — SODIUM CHLORIDE 0.9 % IV SOLN
INTRAVENOUS | Status: AC
Start: 2014-05-10 — End: 2014-05-10
  Administered 2014-05-10: 1000 mL via INTRAVENOUS
  Administered 2014-05-10: 09:00:00 via INTRAVENOUS

## 2014-05-10 MED ORDER — POLYETHYLENE GLYCOL 3350 17 G PO PACK
17.0000 g | PACK | Freq: Every day | ORAL | Status: DC
  Administered 2014-05-10 – 2014-05-12 (×3): 17 g via ORAL
  Filled 2014-05-10 (×4): qty 1

## 2014-05-10 NOTE — Progress Notes (Signed)
PROGRESS NOTE  Carolyn Sanders:[id number] DOB: [birth year] DOA: 05/08/2014 PCP: Zoila Shutter, MD  HPI/Subjective: Carolyn Sanders is an 81 year old female with a past medical history of dementia, HTN, previous CVA, CAD, and afib presented to the [facility] ED on 05/08/14 with increased confusion, drowsiness, weakness, and dry cough. Patient lives with her daughter who has seen a decline over the last week. Patient was previously admitted for possible pneumonia, AKI, and constipation 3 weeks ago.   In the ED, patient was febrile with a temp of 100.4. CXR showed bilateral opacities- pneumonia vs edema. Abdominal radiograph showed moderate amount of stool but no other abnormalities. Her creatinine was 1.4 which was improved since previous admission. She did not have leukocytosis as WBC was 7.4. UA showed no evidence of infection.  Today, [name] is considerably improved. She is oriented to person, but not place and time. She reports no complaints and states she wants to go home. She was much more interactive today and was able to answer questions and follow simple commands. She was able to stand and move to chair with minimal assistance but is still an increased fall risk due to shuffling gait. She also seems to have a reduced appetite as she has only eaten a few bites of each meal. Today her sodium was 133 and creatinine was increased to 1.48 likely due to decreased oral intake. Her tachycardia has resolved with a pulse of 89. I spoke with Carolyn Sanders daughter, Carolyn Sanders, and see agrees that her mother has improved but is concerned about her decreased appetite as this has never been a problem in the past. Carolyn Sanders is the primary care giver for Mrs. Carolyn Sanders as well as Mr. Carolyn Sanders and is currently working days. Will get PT and SW consults.   Assessment/Plan: HCAP (healthcare-associated pneumonia) -Patient was hospitalized 3 weeks ago for pneumonia, presented with history of  cough and CXR suggestive of pneumonia -Started on vancomycin and cefepime -Patient is afebrile today (last temperature 98.7) and has WBC of 7.1 -Currently has oxygen saturation of 97% on 2L nasal cannula  -Influenza panel was negative -Continue antibiotics and wean supplemental oxygen as tolerated by patient.    Chronic renal disease, stage 3, moderately decreased glomerular filtration rate (GFR) between 30-59 mL/min/1.73 square meter -Creatinine on admission was 1.4 -Was given 1L of fluid on admission with improvement  -Was worse today with creatinine of 1.48 and estimated GFR of 31 likely due to decreased oral intake -Started IVF and will monitor with daily BMP   Dementia -Patient appears to be close to baseline according to daughter - Continue home medication of Namenda  daily   Weakness -Likely secondary to pneumonia  -CT head showed no acute abnormalities; chronic ischemia and atrophy -PT evaluated and indicates ambulation with assistance- home is family can provide 24/7 assistance otherwise SNF. Daughter indicated that she was interested in assistance at home but will require further discussion. -SW consult   Chronic atrial fibrillation - Patient is anticoagulated with Pradaxa -Currently on Toprol-XL and Dilacor XR for rate control - Tachycardia resolved with last recorded pulse of 89  -Will continue to monitor   Hypertension -Stable with last reading 137/70 -Continue Dilacor, hydralazine, and Toprol  DVT Prophylaxis:  On Pradaxa   Code Status: Full Family Communication: Spoke with daughter at bedside in afternoon Disposition Plan: Inpatient    Consultants:  Physical therapy  Social work  Procedures:  None  Antibiotics: Anti-infectives    Start  Dose/Rate Route Frequency Ordered Stop   05/09/14 2000  ceFEPIme (MAXIPIME) 1 g in dextrose 5 % 50 mL IVPB     1 g 100 mL/hr over 30 Minutes Intravenous Every 24 hours 05/09/14 0321     05/09/14  0100  vancomycin (VANCOCIN) IVPB 1000 mg/200 mL premix     1,000 mg 200 mL/hr over 60 Minutes Intravenous Daily at bedtime 05/09/14 0009     05/09/14 0000  ceFEPIme (MAXIPIME) 1 g in dextrose 5 % 50 mL IVPB     1 g 100 mL/hr over 30 Minutes Intravenous  Once 05/08/14 2353 05/09/14 0115      Objective: Filed Vitals:   05/09/14 1300 05/09/14 1430 05/09/14 2042 05/10/14 0616  BP:  113/54 142/66 137/70  Pulse:  97 80 89  Temp:  99.9 F (37.7 C) 98.8 F (37.1 C) 98.7 F (37.1 C)  TempSrc:  Oral Oral Oral  Resp:  Height:  (1.549 m)     Weight:      SpO2:  95% 100% 97%    Intake/Output Summary (Last 24 hours) at 05/10/14 1237 Last data filed at 05/10/14 0802  Gross per 24 hour  Intake    240 ml  Output     75 ml  Net    165 ml   Filed Weights   05/09/14 0300  Weight: 68.221 kg (150 lb 6.4 oz)    Exam: General: Well developed, well nourished, appears frail but no apparent distress, appears stated age  HEENT:  PERR, Anicteic Sclera, MMM. No pharyngeal erythema or exudates  Neck: Supple, no JVD, no masses  Cardiovascular: RRR, S1 S2 auscultated, no rubs, murmurs or gallops.   Respiratory: Clear to auscultation bilaterally- hard to assess because patient unable to take deep breaths on command Abdomen: Soft, nontender, nondistended, + bowel sounds  Extremities: warm dry without cyanosis clubbing or edema.  Neuro: Alert- oriented to self but not place or time, cranial nerves grossly intact. Strength 5/5 in upper and lower extremities  Skin: Without rashes exudates or nodules.   Psych: Normal affect and demeanor with intact judgement and insight   Data Reviewed: Basic Metabolic Panel:  Recent Labs Lab 05/08/14 2046 05/09/14 0445 05/10/14 0355  NA 139 138 133*  K 4.4 4.2 3.9  CL 106 104 103  CO2  --  27 24  GLUCOSE 112* 115* 105*  BUN 33* 29* 32*  CREATININE 1.40* 1.30* 1.48*  CALCIUM  --  9.0 8.1*   Liver Function Tests:  Recent Labs Lab  05/09/14 0445  AST 24  ALT 10  ALKPHOS 66  BILITOT 0.7  PROT 6.4  ALBUMIN 3.8   CBC:  Recent Labs Lab 05/08/14 2046 05/09/14 0445 05/10/14 0355  WBC 7.4 5.3 7.1  NEUTROABS 5.1 3.0  --   HGB 13.4  14.6 12.3 12.0  HCT 42.2  43.0 38.2 37.3  MCV 96.8 96.2 96.4  PLT 170 141* 161   BNP (last 3 results)  Recent Labs  05/08/14 2253  BNP 457.4*    ProBNP (last 3 results)  Recent Labs  11/18/13 2157  PROBNP 1073.0*    Studies: Dg Chest 2 View  05/08/2014   CLINICAL DATA:  Acute onset of generalized weakness. Initial encounter.  EXAM: CHEST  2 VIEW  COMPARISON:  Chest radiograph performed 04/15/2014  FINDINGS: The lungs are well-aerated. Mild patchy bilateral airspace opacities may reflect mild pneumonia or interstitial edema, with underlying vascular congestion. No pleural effusion or  pneumothorax is seen.  The heart is mildly enlarged. A relatively large hiatal hernia is suggested. No acute osseous abnormalities are seen. Postoperative change is seen overlying the right axilla.  IMPRESSION: 1. Mild patchy bilateral airspace opacities may reflect mild pneumonia or interstitial edema, with underlying vascular congestion. 2. Mild cardiomegaly. 3. Large hiatal hernia again noted.   Electronically Signed   By: Roanna RaiderJeffery  Chang M.D.   On: 05/08/2014 21:52   Ct Head Wo Contrast  05/09/2014   CLINICAL DATA:  Confusion.  History of Alzheimer's disease  EXAM: CT HEAD WITHOUT CONTRAST  TECHNIQUE: Contiguous axial images were obtained from the base of the skull through the vertex without intravenous contrast.  COMPARISON:  11/11/2012  FINDINGS: Skull and Sinuses:Negative for fracture or destructive process. The mastoids, middle ears, and imaged paranasal sinuses are clear.  Orbits: No acute abnormality.  Brain: No evidence of acute infarction, hemorrhage, hydrocephalus, or mass lesion/mass effect. Brain atrophy correlating with history of dementia. There is chronic small vessel disease with  patchy cerebral white matter ischemic gliosis. Remote lateral lenticulostriate infarct on the right, primarily affecting the caudate head and anterior putamen.  IMPRESSION: 1. No acute intracranial findings. 2. Atrophy and chronic ischemia, as above.   Electronically Signed   By: Marnee SpringJonathon  Watts M.D.   On: 05/09/2014 02:54   Dg Abd 2 Views  05/08/2014   CLINICAL DATA:  Acute onset of abdominal distention and constipation for 2 days. Initial encounter.  EXAM: ABDOMEN - 2 VIEW  COMPARISON:  Abdominal radiograph performed 04/18/2014  FINDINGS: The visualized bowel gas pattern is unremarkable. Scattered air and stool filled loops of colon are seen; no abnormal dilatation of small bowel loops is seen to suggest small bowel obstruction. No free intra-abdominal air is identified on the provided decubitus view. The rectum is largely filled with air.  The visualized osseous structures are within normal limits; the sacroiliac joints are unremarkable in appearance. The visualized lung bases are essentially clear.  IMPRESSION: Unremarkable bowel gas pattern; no free intra-abdominal air seen. Moderate amount of stool noted in the colon. The rectum is largely filled with air.   Electronically Signed   By: Roanna RaiderJeffery  Chang M.D.   On: 05/08/2014 21:54    Scheduled Meds: . antiseptic oral rinse  7 mL Mouth Rinse BID  . bisacodyl  10 mg Rectal Once  . ceFEPime (MAXIPIME) IV  1 g Intravenous Q24H  . dabigatran  75 mg Oral Q12H  . darifenacin  7.5 mg Oral Daily  . diltiazem  180 mg Oral Daily  . docusate sodium  100 mg Oral BID  . feeding supplement (ENSURE COMPLETE)  237 mL Oral BID BM  . hydrALAZINE  100 mg Oral BID  . memantine  28 mg Oral Daily  . metoprolol succinate  25 mg Oral Daily  . polyethylene glycol  17 g Oral Daily  . sodium chloride  500 mL Intravenous Once  . vancomycin  1,000 mg Intravenous QHS   Continuous Infusions: . sodium chloride 75 mL/hr at 05/10/14 0830    Principal Problem:   HCAP  (healthcare-associated pneumonia) Active Problems:   Chronic renal disease, stage 3, moderately decreased glomerular filtration rate (GFR) between 30-59 mL/min/1.73 square meter   Dementia   Weakness   Chronic atrial fibrillation   Hypertension   Pneumonia    Raspect, Erin, PA-S [facility] 05/10/2014, 12:37 PM   Addendum  I personally evaluated patient on 05/10/2014 and agree with the above findings. Patient is a pleasant  76 with a past medical history of dementia admitted to the medicine service after presenting with failure to thrive/functional decline likely related to healthcare associated pneumonia. She was started on broad-spectrum empiric IV antimicrobial therapy with cefepime and vancomycin. On my assessment today she appeared improved, most more awake, interactive, assisted out of bed to chair. She continues to be in steady, however was evaluated by physical therapy recommending home health PT with 24-hour assistance. Labs did show an upward trend in creatinine to 1.48 with BUN of 32 likely secondary to poor by mouth intake. She was bolused with 500 mL's of normal saline and plan to run maintenance fluids at 75 ML's per hour today. Overall she appears to be making progress, anticipate transitioning her to oral antibiotic therapy in a.m.

## 2014-05-11 DIAGNOSIS — R5381 Other malaise: Secondary | ICD-10-CM

## 2014-05-11 LAB — BASIC METABOLIC PANEL
Anion gap: 5 (ref 5–15)
BUN: 25 mg/dL — ABNORMAL HIGH (ref 6–23)
CHLORIDE: 107 mmol/L (ref 96–112)
CO2: 27 mmol/L (ref 19–32)
Calcium: 8.4 mg/dL (ref 8.4–10.5)
Creatinine, Ser: 1.28 mg/dL — ABNORMAL HIGH (ref 0.50–1.10)
GFR calc Af Amer: 43 mL/min — ABNORMAL LOW (ref >90)
GFR, EST NON AFRICAN AMERICAN: 37 mL/min — AB (ref >90)
GLUCOSE: 101 mg/dL — AB (ref 70–99)
POTASSIUM: 4.3 mmol/L (ref 3.5–5.1)
SODIUM: 139 mmol/L (ref 135–145)

## 2014-05-11 LAB — CBC
HEMATOCRIT: 35.7 % — AB (ref 36.0–46.0)
Hemoglobin: 11.4 g/dL — ABNORMAL LOW (ref 12.0–15.0)
MCH: 31.2 pg (ref 26.0–34.0)
MCHC: 31.9 g/dL (ref 30.0–36.0)
MCV: 97.8 fL (ref 78.0–100.0)
Platelets: 149 10*3/uL — ABNORMAL LOW (ref 150–400)
RBC: 3.65 MIL/uL — ABNORMAL LOW (ref 3.87–5.11)
RDW: 15.1 % (ref 11.5–15.5)
WBC: 5.4 10*3/uL (ref 4.0–10.5)

## 2014-05-11 MED ORDER — ACETAMINOPHEN 325 MG PO TABS: 650.0000 mg | ORAL_TABLET | Freq: Four times a day (QID) | ORAL | Status: AC | PRN

## 2014-05-11 MED ORDER — CEFUROXIME AXETIL 500 MG PO TABS: 500.0000 mg | ORAL_TABLET | Freq: Two times a day (BID) | ORAL | Status: DC

## 2014-05-11 NOTE — Care Management Note (Signed)
05/11/14 Sandford Crazeora Montavius Subramaniam RN,BSN,NCM [phone] Pt to now DC to SNF. [facility] rep made aware. IM letter delivered to pt.

## 2014-05-11 NOTE — Progress Notes (Signed)
[facility]  Patient Status: Active (receiving services up to time of hospitalization)  [facility] is providing the following services: RN, PT, OT and MSW  If patient discharges after hours, please call [phone].   Lanae CrumblyKristen Hayworth 05/11/2014, 9:19 AM

## 2014-05-11 NOTE — Progress Notes (Signed)
CSW continuing to follow.  CSW followed up with pt daughter, Mordecai Maesherese at bedside. CSW introduced self. CSW provided pt daughter with SNF bed offers. Pt daughter shared that she was able to speak with pt doctor and some members of pt church community and pt [facility]. Pt daughter expressed that she feels that pt will be most comfortable in a private room.   [facility] and confirmed that facility had private room available for tomorrow. Pt daughter agreeable to [facility] to [facility] paperwork at 3 pm. [facility] requested a preliminary discharge summary and MD notified. CSW faxed preliminary [facility] summary [facility]. Weekend CSW to facilitate pt discharge [facility] tomorrow by contacting weekend supervisor at [phone] (RN to call report to this number as well) and faxing final discharge summary to [phone]. Pt daughter request non-emergency [facility].   Weekend CSW to facilitate pt discharge needs when pt medically ready. Anticipate d/c tomorrow.  Loletta SpecterSuzanna Kallee Nam, MSW, LCSW Clinical Social Work [phone]

## 2014-05-11 NOTE — Discharge Summary (Addendum)
Physician Discharge Summary  [name]:[id number] DOB: [birth year] DOA: 05/08/2014  PCP: Zoila Shutter, MD  Admit date: 05/08/2014 Discharge date: 05/11/2014  Time spent: 35 minutes  Recommendations for Outpatient Follow-up:  1. Please follow up on BMP in 3-4 days, she was found to have acute kidney injury during this hospitalization 2. Ceftin 500 mg PO BID x 5 days  Discharge Diagnoses:  Principal Problem:   HCAP (healthcare-associated pneumonia) Active Problems:   Chronic renal disease, stage 3, moderately decreased glomerular filtration rate (GFR) between 30-59 mL/min/1.73 square meter   Dementia   Weakness   Chronic atrial fibrillation   Hypertension   Pneumonia   Discharge Condition: Stable  Diet recommendation: Heart Healthy; patient needing to be prompted to have meals  Filed Weights   05/09/14 0300  Weight: 68.221 kg (150 lb 6.4 oz)    History of present illness:  Carolyn Sanders is a 81 y.o. female with history of dementia, chronic atrial fibrillation, hypertension and chronic kidney disease who was recently admitted for obstipation was brought to the ER after patient was found to be increasingly confused and weak and difficulty to walk. In the ER patient was found to be nonfocal and lethargic. Patient had a fever of 100.62F. Chest x-ray was showing possible infiltrates versus edema. Patient's daughter states that patient has been having cough for last 1 week. Denies any nausea vomiting abdominal pain but did have one episode of diarrhea last week. UA is unremarkable. Patient will be admitted for encephalopathy most likely from fever secondary to pneumonia.   Hospital Course:  HCAP (healthcare-associated pneumonia) -Patient was hospitalized 3 weeks ago for pneumonia, presented with history of cough and CXR suggestive of pneumonia -Started on vancomycin and cefepime -Patient is afebrile (last temperature 98.7) with AM labs on 05/11/2014 showing WBC of  5.4 -Has ambulatory sats of 94% -Influenza panel was negative -Plan to discharge to SNF on Ceftin 500 mg PO BID x 5 days   Chronic renal disease, stage 3, moderately decreased glomerular filtration rate (GFR) between 30-59 mL/min/1.73 square meter -Creatinine on admission was 1.4 -Was given 1L of fluid on admission with improvement  -Was worse today with creatinine of 1.48 and estimated GFR of 31 likely due to decreased oral intake -On 05/11/2014 creatinine improving to 1.28 -Repeat BMP in 3-4 days   Dementia -Patient deconditioned, having unsteady gait, will plan to discharge to SNF for rehab in the next 24 hours - Continue home medication of Namenda  daily   Weakness -Likely secondary to pneumonia  -CT head showed no acute abnormalities; chronic ischemia and atrophy -Plan for discharge to SNF in the next 24 hours   Chronic atrial fibrillation - Patient is anticoagulated with Pradaxa -Currently on Toprol-XL and Dilacor XR for rate control   Hypertension -Stable with last reading 119/63  Addendum  I personally saw and evaluated patient on 05/12/2014 prior to discharge. There were no acute events overnight, patient is sitting up comfortably, pleasantly demented, in no acute distress, tolerating by mouth intake. On physical exam her lungs were clear, abdomen benign, no tremor edema. Plan to transition her to skilled nursing facility today to undergo rehabilitation. Patient will be on oral antimicrobial therapy with Ceftin 500 mg by mouth twice a day for 5 days.  Consultations:  Physical Therapy  Discharge Exam: Filed Vitals:   05/11/14 0630  BP:   Pulse:   Temp: 99.2 F (37.3 C)  Resp:     General: Well developed, well nourished, appears  frail but in no distress, appears stated age  HEENT: PERR, Anicteic Sclera, MMM. No pharyngeal erythema or exudates  Neck: Supple, no JVD, no masses  Cardiovascular: RRR, S1 S2 auscultated, no rubs, murmurs or gallops.   Respiratory: Clear to auscultation bilaterally with equal chest rise- hard to assess as patient does not take deep breaths on command  Abdomen: Soft, nontender, nondistended, + bowel sounds  Extremities: warm dry without cyanosis clubbing or edema.  Neuro: Alert- oriented to self but not place/time. Strength 4/5 in upper and lower extremities  Skin: Without rashes exudates or nodules.  Psych: Patient is confused, disoriented, pleasant and does follow simple commands  Discharge Instructions    Current Discharge Medication List    START taking these medications   Details  acetaminophen (TYLENOL) 325 MG tablet Take 2 tablets (650 mg total) by mouth every 6 (six) hours as needed for mild pain (or Fever >/= 101). Qty: 30 tablet, Refills: 1    cefUROXime (CEFTIN) 500 MG tablet Take 1 tablet (500 mg total) by mouth 2 (two) times daily with a meal. Qty: 10 tablet, Refills: 0      CONTINUE these medications which have NOT CHANGED   Details  Calcium Carbonate-Vit D-Min (CALCIUM 600+D PLUS MINERALS) 600-400 MG-UNIT TABS Take 1 tablet by mouth daily.    dabigatran (PRADAXA) 75 MG CAPS Take 75 mg by mouth every 12 (twelve) hours.      diltiazem (DILACOR XR) 180 MG 24 hr capsule Take 180 mg by mouth daily.    docusate sodium (COLACE) 100 MG capsule Take 100 mg by mouth 2 (two) times daily.    hydrALAZINE (APRESOLINE) 50 MG tablet Take 100 mg by mouth 2 (two) times daily.    memantine (NAMENDA XR) 28 MG CP24 24 hr capsule Take 28 mg by mouth daily.    metoprolol succinate (TOPROL-XL) 25 MG 24 hr tablet Take 25 mg by mouth daily.    Multiple Vitamin (MULTIVITAMIN WITH MINERALS) TABS tablet Take 1 tablet by mouth daily.    solifenacin (VESICARE) 10 MG tablet Take 10 mg by mouth daily.    bisacodyl (DULCOLAX) 10 MG suppository Place 1 suppository (10 mg total) rectally daily as needed for moderate constipation. Qty: 12 suppository, Refills: 0    polyethylene glycol (MIRALAX /  GLYCOLAX) packet Take 17 g by mouth 2 (two) times daily. Qty: 14 each, Refills: 0    senna-docusate (SENOKOT-S) 8.6-50 MG per tablet Take 1 tablet by mouth at bedtime.       No Known Allergies Follow-up Information    Follow up with WOODYEAR,WYNNE E, MD In 2 weeks.   Specialty:  Internal Medicine   Contact information:   [address] [place] [address] [phone]        The results of significant diagnostics from this hospitalization (including imaging, microbiology, ancillary and laboratory) are listed below for reference.    Significant Diagnostic Studies: Dg Chest 2 View  05/08/2014   CLINICAL DATA:  Acute onset of generalized weakness. Initial encounter.  EXAM: CHEST  2 VIEW  COMPARISON:  Chest radiograph performed 04/15/2014  FINDINGS: The lungs are well-aerated. Mild patchy bilateral airspace opacities may reflect mild pneumonia or interstitial edema, with underlying vascular congestion. No pleural effusion or pneumothorax is seen.  The heart is mildly enlarged. A relatively large hiatal hernia is suggested. No acute osseous abnormalities are seen. Postoperative change is seen overlying the right axilla.  IMPRESSION: 1. Mild patchy bilateral airspace opacities may reflect  mild pneumonia or interstitial edema, with underlying vascular congestion. 2. Mild cardiomegaly. 3. Large hiatal hernia again noted.   Electronically Signed   By: Roanna Raider M.D.   On: 05/08/2014 21:52   Dg Abd 1 View  04/17/2014   CLINICAL DATA:  Face impaction.  EXAM: ABDOMEN - 1 VIEW  COMPARISON:  04/15/2014.  FINDINGS: Soft tissue structures are unremarkable. Prominent stool again noted throughout the colon particularly in the rectum. Air-filled loops of small and large bowel noted suggesting adynamic ileus. Aortoiliac excluded-disease. No acute bony abnormality.  IMPRESSION: Prominent amount of stool remains throughout the ankle project in the rectum. Distended loops of bowel again  noted suggesting adynamic ileus.   Electronically Signed   By: Maisie Fus  Register   On: 04/17/2014 11:56   Ct Head Wo Contrast  05/09/2014   CLINICAL DATA:  Confusion.  History of Alzheimer's disease  EXAM: CT HEAD WITHOUT CONTRAST  TECHNIQUE: Contiguous axial images were obtained from the base of the skull through the vertex without intravenous contrast.  COMPARISON:  11/11/2012  FINDINGS: Skull and Sinuses:Negative for fracture or destructive process. The mastoids, middle ears, and imaged paranasal sinuses are clear.  Orbits: No acute abnormality.  Brain: No evidence of acute infarction, hemorrhage, hydrocephalus, or mass lesion/mass effect. Brain atrophy correlating with history of dementia. There is chronic small vessel disease with patchy cerebral white matter ischemic gliosis. Remote lateral lenticulostriate infarct on the right, primarily affecting the caudate head and anterior putamen.  IMPRESSION: 1. No acute intracranial findings. 2. Atrophy and chronic ischemia, as above.   Electronically Signed   By: Marnee Spring M.D.   On: 05/09/2014 02:54   Dg Abd 2 Views  05/08/2014   CLINICAL DATA:  Acute onset of abdominal distention and constipation for 2 days. Initial encounter.  EXAM: ABDOMEN - 2 VIEW  COMPARISON:  Abdominal radiograph performed 04/18/2014  FINDINGS: The visualized bowel gas pattern is unremarkable. Scattered air and stool filled loops of colon are seen; no abnormal dilatation of small bowel loops is seen to suggest small bowel obstruction. No free intra-abdominal air is identified on the provided decubitus view. The rectum is largely filled with air.  The visualized osseous structures are within normal limits; the sacroiliac joints are unremarkable in appearance. The visualized lung bases are essentially clear.  IMPRESSION: Unremarkable bowel gas pattern; no free intra-abdominal air seen. Moderate amount of stool noted in the colon. The rectum is largely filled with air.   Electronically  Signed   By: Roanna Raider M.D.   On: 05/08/2014 21:54   Dg Abd Acute W/chest  04/15/2014   CLINICAL DATA:  Abdominal distention.  No bowel movement in 2 weeks.  EXAM: ACUTE ABDOMEN SERIES (ABDOMEN 2 VIEW & CHEST 1 VIEW)  COMPARISON:  04/03/2014  FINDINGS: Cardiac enlargement. Pulmonary vascularity appears normal. Scattered fibrosis in the lungs. Increased density in the right upper lung suggests infiltration or atelectasis. Pneumonia should be excluded. No blunting of costophrenic angles. No pneumothorax. Calcified and tortuous aorta. Surgical clips in the right axilla. Large esophageal hiatal hernia behind the heart.  Diffusely stool-filled colon with scattered gas in the colon and small bowel. No small or large bowel distention. No free intra-abdominal air. No abnormal air-fluid levels. No radiopaque stones identified. Vascular calcifications. Degenerative changes in the spine and hips.  IMPRESSION: Cardiac enlargement without vascular congestion. Infiltration or atelectasis in the right upper lung possibly due to pneumonia. Diffusely stool-filled colon consistent with clinical history of constipation. No evidence of  bowel obstruction or free air.   Electronically Signed   By: Burman NievesWilliam  Stevens M.D.   On: 04/15/2014 18:03   Dg Abd Portable 1v  04/18/2014   CLINICAL DATA:  81 year old female with constipation, abdominal pain and distension  EXAM: PORTABLE ABDOMEN - 1 VIEW  COMPARISON:  Prior abdominal radiograph 04/17/2014  FINDINGS: Significant interval decrease in stool overlying the rectum. Gas noted throughout both large and small bowel without evidence of significant distension or obstruction. There is mild thickening of the cysts mucosal wall of the rectum. The bones appear osteopenic. No acute osseous abnormality.  IMPRESSION: 1. Significant interval decrease in the rectal stool burden. 2. Mild thickening of the rectal wall may represent changes related to stercoral colitis. 3. No evidence of  obstruction.   Electronically Signed   By: Malachy MoanHeath  McCullough M.D.   On: 04/18/2014 12:26   Dg Abd Portable 1v  04/16/2014   CLINICAL DATA:  Obstipation.  EXAM: PORTABLE ABDOMEN - 1 VIEW  COMPARISON:  04/15/2014  FINDINGS: Diffusely stool-filled colon with scattered gas in the colon and small bowel. No small or large bowel distention. Findings are consistent with clinical constipation. No radiopaque stones. Vascular calcifications. Degenerative changes in the spine and hips.  IMPRESSION: Diffusely stool-filled colon without evidence of obstruction.   Electronically Signed   By: Burman NievesWilliam  Stevens M.D.   On: 04/16/2014 00:00    Microbiology: No results found for this or any previous visit (from the past 240 hour(s)).   Labs: Basic Metabolic Panel:  Recent Labs Lab 05/08/14 2046 05/09/14 0445 05/10/14 0355 05/11/14 0400  NA 139 138 133* 139  K 4.4 4.2 3.9 4.3  CL 106 104 103 107  CO2  --  27 24 27   GLUCOSE 112* 115* 105* 101*  BUN 33* 29* 32* 25*  CREATININE 1.40* 1.30* 1.48* 1.28*  CALCIUM  --  9.0 8.1* 8.4   Liver Function Tests:  Recent Labs Lab 05/09/14 0445  AST 24  ALT 10  ALKPHOS 66  BILITOT 0.7  PROT 6.4  ALBUMIN 3.8   No results for input(s): LIPASE, AMYLASE in the last 168 hours. No results for input(s): AMMONIA in the last 168 hours. CBC:  Recent Labs Lab 05/08/14 2046 05/09/14 0445 05/10/14 0355 05/11/14 0400  WBC 7.4 5.3 7.1 5.4  NEUTROABS 5.1 3.0  --   --   HGB 13.4  14.6 12.3 12.0 11.4*  HCT 42.2  43.0 38.2 37.3 35.7*  MCV 96.8 96.2 96.4 97.8  PLT 170 141* 161 149*   Cardiac Enzymes: No results for input(s): CKTOTAL, CKMB, CKMBINDEX, TROPONINI in the last 168 hours. BNP: BNP (last 3 results)  Recent Labs  05/08/14 2253  BNP 457.4*    ProBNP (last 3 results)  Recent Labs  11/18/13 2157  PROBNP 1073.0*    CBG: No results for input(s): GLUCAP in the last 168 hours.     SignedJeralyn Bennett:  Zyren Sevigny  [facility] 05/11/2014,  2:04 PM

## 2014-05-11 NOTE — Care Management Note (Deleted)
CARE MANAGEMENT NOTE 05/11/2014  Patient:  Carolyn Sanders,Carolyn Sanders   Account Number:  [id number]  Date Initiated:  05/10/2014  Documentation initiated by:  Sandford CrazeLEMENTS,Magnum Lunde  Subjective/Objective Assessment:   81 yo admitted with Malignant neoplasm of the pancreas. history of pancreatic cancer on chemotherapy     Action/Plan:   From home with spouse   Anticipated DC Date:  05/12/2014   Anticipated DC Plan:  HOME/SELF CARE      DC Planning Services  CM consult      Choice offered to / List presented to:             Status of service:  In process, will continue to follow Medicare Important Message given?  YES (If response is "NO", the following Medicare IM given date fields will be blank) Date Medicare IM given:  05/11/2014 Medicare IM given by:  Sandford CrazeLEMENTS,Theona Muhs Date Additional Medicare IM given:   Additional Medicare IM given by:    Discharge Disposition:    Per UR Regulation:  Reviewed for med. necessity/level of care/duration of stay  If discussed at Long Length of Stay Meetings, dates discussed:    Comments:  05/10/14 Sandford Crazeora Amaliya Whitelaw RN,BSN,NCM [phone] Chart reviewed and CM following for DC needs.

## 2014-05-11 NOTE — Progress Notes (Addendum)
Clinical Social Work Department CLINICAL SOCIAL WORK PLACEMENT NOTE 05/11/2014  Patient:  Carolyn Sanders,[name]  Account Number:  [id number] Admit date:  05/08/2014  Clinical Social Worker:  Garlan FairSUZANNA KIDD, LCSWA  Date/time:  05/11/2014 10:05 AM  Clinical Social Work is seeking post-discharge placement for this patient at the following level of care:   SKILLED NURSING   (*CSW will update this form in Epic as items are completed)   05/11/2014  Patient/family provided with [facility] Department of Clinical Social Work's list of facilities offering this level of care within the geographic area requested by the patient (or if unable, by the patient's family).  05/11/2014  Patient/family informed of their freedom to choose among providers that offer the needed level of care, that participate in Medicare, Medicaid or managed care program needed by the patient, have an available bed and are willing to accept the patient.  05/11/2014  Patient/family informed of [facility]' ownership interest in [facility], as well as of the fact that they are under no obligation to receive care at this facility.  PASARR submitted to EDS on 05/11/2014 PASARR number received on 05/11/2014  FL2 transmitted to all facilities in geographic area requested by pt/family on  05/11/2014 FL2 transmitted to all facilities within larger geographic area on   Patient informed that his/her managed care company has contracts with or will negotiate with  certain facilities, including the following:     Patient/family informed of bed offers received:  05/11/2014 [facility] Physician recommends and patient chooses bed at    Patient to be transferred to  on   Patient to be transferred to facility by  Patient and family notified of transfer on  Name of family member notified:    The following physician request were entered in Epic:   Additional Comments: MD please sign FL2  on shadow chart.    Loletta SpecterSuzanna Kidd, MSW, LCSW Clinical Social Work [phone]

## 2014-05-11 NOTE — Progress Notes (Signed)
Clinical Social Work Department BRIEF PSYCHOSOCIAL ASSESSMENT 05/11/2014  Patient:  Carolyn Sanders,Carolyn Sanders     Account Number:  [id number]     Admit date:  05/08/2014  Clinical Social Worker:  Garlan FairKIDD,SUZANNA, LCSWA  Date/Time:  05/11/2014 10:00 AM  Referred by:  Physician  Date Referred:  05/11/2014 Referred for  SNF Placement   Other Referral:   Interview type:  Family Other interview type:    PSYCHOSOCIAL DATA Living Status:  FAMILY Admitted from facility:   Level of care:   Primary support name:  Carolyn Sanders Freeman/daughter/[phone] Primary support relationship to patient:  CHILD, ADULT Degree of support available:   adequate    CURRENT CONCERNS Current Concerns  Post-Acute Placement   Other Concerns:    SOCIAL WORK ASSESSMENT / PLAN CSW received referral for New SNF.    CSW spoke with MD who reports that PT [facility] with [facility]. MD has had discussion with pt daughter and pt daughter reports that pt daughter also provides care for pt husband in the home and the caregivers that come during the day are not very reliable and pt daughter [facility] before pt returning home. Per MD, pt will be medically ready for d/c tomorrow.    CSW contacted pt daughter, Carolyn Sanders via telephone. CSW introduced self and explained role. Pt daughter confirmed that she feels that safest plan for pt will be for short [facility]. CSW provided support as pt daughter discussed that pt has not been in rehab before, but pt husband has been to rehab in the past. Pt daughter stated that she does not have Sanders specific facility in mind and is agreeable to CSW [facility]. CSW discussed process and prepared pt daughter that decision will need to be made today in order for pt to discharge to SNF tomorrow. Pt daughter expressed understanding.    CSW completed FL2 and initiated [facility]. CSW to follow up with pt daughter regarding  SNF bed offers in order for pt daughter to make decision for discharge to be facilitated tomorrow.    CSW to continue to follow to provide support and assist with pt disposition needs.   Assessment/plan status:  Psychosocial Support/Ongoing Assessment of Needs Other assessment/ plan:   discharge planning   Information/referral to community resources:   [facility]    PATIENT'S/FAMILY'S RESPONSE TO PLAN OF CARE: Pt alert and oriented to person only. Pt has Sanders history of dementia. Pt daughter supportive and actively involved in pt care, but feels that it will be too overwhelming to care for pt and pt husband at home at this time and agreeable to short term rehab for pt. Pt daughter recogonizes that decision for short term rehab will have to be made quickly in order for discharge tomorrow to be facilitated.   Carolyn SpecterSuzanna Kidd, MSW, LCSW Clinical Social Work [phone]

## 2014-05-11 NOTE — Progress Notes (Signed)
Physical Therapy Treatment Patient Details Name: Carolyn Sanders MRN: [id number] DOB: 03/16/1925 Today's Date: 05/11/2014    History of Present Illness 81 y.o. female with history of dementia, chronic atrial fibrillation, hypertension and chronic kidney disease who was recently admitted for constipation was brought to the ER after patient was found to be increasingly confused and weak and difficulty to walk; admitted for HCAP and weakness.    PT Comments    Assisted pt OOb to amb in hallway twice.  Unsteady gait and required assist with RW.  HIGH FALL RISK.  Will need 24/7 supervision with gait.  Follow Up Recommendations  Home health PT;Supervision/Assistance - 24 hour     Equipment Recommendations  None recommended by PT    Recommendations for Other Services       Precautions / Restrictions Precautions Precautions: Fall Precaution Comments: incontinence Restrictions Weight Bearing Restrictions: No    Mobility  Bed Mobility Overal bed mobility: Needs Assistance Bed Mobility: Supine to Sit;Sit to Supine     Supine to sit: Min assist Sit to supine: Min assist   General bed mobility comments: verbal and visual cues for technique, assist for feet onto bed.  Transfers Overall transfer level: Needs assistance Equipment used: Rolling walker (2 wheeled) Transfers: Sit to/from Stand Sit to Stand: Min assist         General transfer comment: verbal cues for technique and using UEs to assist, assist for rise and steadying as well as controlling descent.  Max cueing to complete turns prior to sit.  Ambulation/Gait Ambulation/Gait assistance: Min assist Ambulation Distance (Feet): 45 Feet (45 feet x 2 one long sitting rest break) Assistive device: Rolling walker (2 wheeled) Gait Pattern/deviations: Step-through pattern;Trunk flexed;Narrow base of support;Drifts right/left;Shuffle Gait velocity: decreased   General Gait Details: verbal cues for safety and avoiding  objects, assist to steady occasionally.     Stairs            Wheelchair Mobility    Modified Rankin (Stroke Patients Only)       Balance                                    Cognition Arousal/Alertness: Awake/alert   Overall Cognitive Status: History of cognitive impairments - at baseline       Memory: Decreased short-term memory              Exercises      General Comments        Pertinent Vitals/Pain Pain Assessment: No/denies pain    Home Living                      Prior Function            PT Goals (current goals can now be found in the care plan section) Progress towards PT goals: Progressing toward goals    Frequency  Min 3X/week    PT Plan      Co-evaluation             End of Session Equipment Utilized During Treatment: Gait belt Activity Tolerance: Patient tolerated treatment well Patient left: in bed;with call bell/phone within reach;with bed alarm set     Time: 1115-1140 PT Time Calculation (min) (ACUTE ONLY): 25 min  Charges:  $Gait Training: 8-22 mins $Therapeutic Activity: 8-22 mins  G Codes:      Rica Koyanagi  PTA [facility]  Acute  Rehab Pager      [phone]

## 2014-05-11 NOTE — Progress Notes (Signed)
PROGRESS NOTE  Carolyn Sanders ZOX:[id number] DOB: Nov 02, 1925 DOA: 05/08/2014 PCP: Zoila Shutter, MD  HPI/Subjective: Carolyn Sanders is an 81 year old female with a past medical history of dementia, HTN, previous CVA, CAD, and afib presented to the [facility] ED on 05/08/14 with increased confusion, drowsiness, weakness, and dry cough. Patient lives with her daughter who has seen a decline over the last week. Patient was previously admitted for possible pneumonia, AKI, and constipation 3 weeks ago.   In the ED, patient was febrile with a temp of 100.4. CXR showed bilateral opacities- pneumonia vs edema. Abdominal radiograph showed moderate amount of stool but no other abnormalities. Her creatinine was 1.4 which was improved since previous admission. She did not have leukocytosis as WBC was 7.4. UA showed no evidence of infection.  Today, Carolyn Sanders is feeling much better but is still very weak. She is oriented to self but not place/time which is her baseline according to care giver. Today she was interactive- able to answer some questions and follow simple commands. We assisted Carolyn Sanders in walking to the nurses station which she was able to do, thought she remains an increased fall risk as she is currently a two person assist and has a shuffling/unsteady gait. After ambulating her oxygen saturation was 94%. She is eating more, but requires constant cues and guidance. Labs improved with fluids and today sodium 139 and creatinine 1.28. Dr. Vanessa Barbara spoke on the phone with Carolyn Sanders, Carolyn Sanders, who stated that there was unreliable help during the day and agreed to SNF placement. Social work has started this process and been in contact with the family.   Assessment/Plan: HCAP (healthcare-associated pneumonia) -Patient was hospitalized 3 weeks ago for pneumonia, presented with history of cough and CXR suggestive of pneumonia -Started on vancomycin and cefepime when  admitted  -Transitioned to Ceftin  BID -Patient is afebrile today (last temperature 99.2) and has WBC of 5.4 -Patient appears to be tolerating 0.5L oxygen and had oxygen saturation of 94% after ambulating -Influenza panel was negative -Continue antibiotics and continue to wean supplemental oxygen as tolerated by patient.    Chronic renal disease, stage 3, moderately decreased glomerular filtration rate (GFR) between 30-59 mL/min/1.73 square meter -Creatinine on admission was 1.4 -Was given 1L of fluid on admission with improvement  -Yesterday increased to 1.48 likely due to decreased oral intake so IVF started  -Today creatinine has improved to 1.28 -Strongly encourage oral intake  -Monitor with daily BMP   Dementia -Patient appears to be close to baseline according to daughter - Continue home medication of Namenda  daily   Weakness -Likely secondary to pneumonia/hospitalzation, suspect has poor functional status at home -CT head showed no acute abnormalities; chronic ischemia and atrophy -PT evaluated and indicates ambulation with assistance- home is family can provide 24/7 assistance otherwise SNF. -Discussed with daughter who stated that there was not reliable help during the day and is agreeable to SNF placement.  -SW consulted and has contacted the family   Chronic atrial fibrillation - Patient is anticoagulated with Pradaxa -Currently on Toprol-XL and Dilacor XR for rate control - Tachycardia resolved with last recorded pulse of 89  -Continue medication and will monitor    Hypertension -Stable with last reading 119/63 -Continue Dilacor, hydralazine, and Toprol  DVT Prophylaxis:  On Pradaxa   Code Status: Full Family Communication: Dr. Vanessa Barbara spoke with daughter, Carolyn Sanders, via telephone Disposition Plan: Anticipate SNF placement in the next 24 hours  Consultants:  Physical therapy  Social  Work  Procedures:  None  Antibiotics: Anti-infectives    Start     Dose/Rate Route Frequency Ordered Stop   05/10/14 1830  cefUROXime (CEFTIN) tablet 500 mg     500 mg Oral 2 times daily with meals 05/10/14 1737     05/09/14 2000  ceFEPIme (MAXIPIME) 1 g in dextrose 5 % 50 mL IVPB  Status:  Discontinued     1 g 100 mL/hr over 30 Minutes Intravenous Every 24 hours 05/09/14 0321 05/10/14 1737   05/09/14 0100  vancomycin (VANCOCIN) IVPB 1000 mg/200 mL premix  Status:  Discontinued     1,000 mg 200 mL/hr over 60 Minutes Intravenous Daily at bedtime 05/09/14 0009 05/10/14 1737   05/09/14 0000  ceFEPIme (MAXIPIME) 1 g in dextrose 5 % 50 mL IVPB     1 g 100 mL/hr over 30 Minutes Intravenous  Once 05/08/14 2353 05/09/14 0115      Objective: Filed Vitals:   05/10/14 1306 05/10/14 2104 05/11/14 0619 05/11/14 0630  BP: 130/58 112/56 119/63   Pulse: 98 99 89   Temp: 99 F (37.2 C) 98.2 F (36.8 C) 100.2 F (37.9 C) 99.2 F (37.3 C)  TempSrc: Oral Axillary Oral Oral  Resp: 18 18 18    Height:      Weight:      SpO2: 99% 99% 95%     Intake/Output Summary (Last 24 hours) at 05/11/14 [id number] Last data filed at 05/10/14 1859  Gross per 24 hour  Intake 1742.5 ml  Output     75 ml  Net 1667.5 ml   Filed Weights   05/09/14 0300  Weight: 68.221 kg (150 lb 6.4 oz)    Exam: General: Well developed, well nourished, appears frail but in no distress, appears stated age  HEENT:  PERR, Anicteic Sclera, MMM. No pharyngeal erythema or exudates  Neck: Supple, no JVD, no masses  Cardiovascular: RRR, S1 S2 auscultated, no rubs, murmurs or gallops.   Respiratory: Clear to auscultation bilaterally with equal chest rise- hard to assess as patient does not take deep breaths on command  Abdomen: Soft, nontender, nondistended, + bowel sounds  Extremities: warm dry without cyanosis clubbing or edema.  Neuro: Alert- oriented to self but not place/time. Strength 4/5 in upper and lower extremities   Skin: Without rashes exudates or nodules.   Psych: Very pleasant, unable to answer many questions due to advanced dementia    Data Reviewed: Basic Metabolic Panel:  Recent Labs Lab 05/08/14 2046 05/09/14 0445 05/10/14 0355 05/11/14 0400  NA 139 138 133* 139  K 4.4 4.2 3.9 4.3  CL 106 104 103 107  CO2  --  27 24 27   GLUCOSE 112* 115* 105* 101*  BUN 33* 29* 32* 25*  CREATININE 1.40* 1.30* 1.48* 1.28*  CALCIUM  --  9.0 8.1* 8.4   Liver Function Tests:  Recent Labs Lab 05/09/14 0445  AST 24  ALT 10  ALKPHOS 66  BILITOT 0.7  PROT 6.4  ALBUMIN 3.8   CBC:  Recent Labs Lab 05/08/14 2046 05/09/14 0445 05/10/14 0355 05/11/14 0400  WBC 7.4 5.3 7.1 5.4  NEUTROABS 5.1 3.0  --   --   HGB 13.4  14.6 12.3 12.0 11.4*  HCT 42.2  43.0 38.2 37.3 35.7*  MCV 96.8 96.2 96.4 97.8  PLT 170 141* 161 149*   BNP (last 3 results)  Recent Labs  05/08/14 2253  BNP 457.4*   ProBNP (last 3 results)  Recent Labs  11/18/13 2157  PROBNP 1073.0*    Studies: No results found.  Scheduled Meds: . antiseptic oral rinse  7 mL Mouth Rinse BID  . cefUROXime  500 mg Oral BID WC  . dabigatran  75 mg Oral Q12H  . darifenacin  7.5 mg Oral Daily  . diltiazem  180 mg Oral Daily  . docusate sodium  100 mg Oral BID  . feeding supplement (ENSURE COMPLETE)  237 mL Oral BID BM  . hydrALAZINE  100 mg Oral BID  . memantine  28 mg Oral Daily  . metoprolol succinate  25 mg Oral Daily  . polyethylene glycol  17 g Oral Daily   Continuous Infusions:   Principal Problem:   HCAP (healthcare-associated pneumonia) Active Problems:   Chronic renal disease, stage 3, moderately decreased glomerular filtration rate (GFR) between 30-59 mL/min/1.73 square meter   Dementia   Weakness   Chronic atrial fibrillation   Hypertension   Pneumonia    Raspect, Erin, PA-S [facility] 05/11/2014, 9:38 AM   Addendum  I personally evaluated patient on 05/11/2014 and agree with the above  findings. Patient showing ongoing clinical improvement. She was admitted for probable healthcare associated pneumonia initially started on broad-spectrum IV antimicrobial therapy with cefepime and vancomycin. She was transitioned to oral cefuroxime today. She appears to be more awake and alert, ambulated down the hallway to the nurses station and back, however, required 2 person assist. I noticed her have unsteady gait. I spoke with her daughter over telephone conversation who stated that she did not have reliable help during the day while she was at work. We felt she would benefit from short-term stay at a skilled nursing facility for rehabilitation. Otherwise labs were reviewed kidney function improving, anticipate discharge to skilled nursing facility in the next 24 hours. Social work consulted.

## 2014-05-12 NOTE — Progress Notes (Signed)
[facility].Patient is stable,comfortable,denies pain.Hulda Marin- Taron Conrey RN

## 2014-05-12 NOTE — Progress Notes (Signed)
Patient to be d/c to [facility] today,report given to RN Evlyn Courierupert Smith. Patient is stable.

## 2014-05-12 NOTE — Progress Notes (Signed)
CARE MANAGEMENT NOTE 05/12/2014  Patient:  Carolyn Sanders,Carolyn Sanders   Account Number:  [id number]  Date Initiated:  05/12/2014  Documentation initiated [facility]:  [facility] Col[facility]  Subjective/Objective Assessment:     Action/Plan:   Anticipated DC Date:  05/12/2014   Anticipated DC Plan:  SKILLED NURSING FACILITY  In-house referral  Clinical Social Worker      DC Planning Services  CM consult      Choice offered to / List presented to:             Status of service:  Completed, signed off Medicare Important Message given?  YES (If response is "NO", the following Medicare IM given date fields will be blank) Date Medicare IM given:  05/12/2014 Medicare IM given by:  [facility]Irbin Fines Date Additional Medicare IM given:   Additional Medicare IM given by:    Discharge Disposition:  SKILLED NURSING FACILITY  Per UR Regulation:  Reviewed for med. necessity/level of care/duration of stay  If discussed at Long Length of Stay Meetings, dates discussed:    Comments:  05/12/2014 1000 Chart reviewed. Isidoro DonningAlesia Kaled Allende RN CCM Case Mgmt phone [phone]

## 2014-05-12 NOTE — Progress Notes (Signed)
Medicare Important Message given? YES  Date Medicare IM given:  05/12/2014 Medicare IM given by: Toluwani Yadav   

## 2014-05-12 NOTE — Clinical Social Work Placement (Signed)
Clinical Social Work Department CLINICAL SOCIAL WORK PLACEMENT NOTE 05/12/2014  Patient:  Carolyn Sanders,Carolyn Sanders  Account Number:  [id number] Admit date:  05/08/2014  Clinical Social Worker:  Garlan FairSUZANNA KIDD, LCSWA  Date/time:  05/11/2014 10:05 AM  Clinical Social Work is seeking post-discharge placement for this patient at the following level of care:   SKILLED NURSING   (*CSW will update this form in Epic as items are completed)   05/11/2014  Patient/family provided with [facility] of Clinical Social Work's list of facilities offering this level of care within the geographic area requested by the patient (or if unable, by the patient's family).  05/11/2014  Patient/family informed of their freedom to choose among providers that offer the needed level of care, that participate in Medicare, Medicaid or managed care program needed by the patient, have an available bed and are willing to accept the patient.  05/11/2014  Patient/family informed of [facility]' ownership interest in [facility], as well as of the fact that they are under no obligation to receive care at this facility.  PASARR submitted to EDS on 05/11/2014 PASARR number received on 05/11/2014  FL2 transmitted to all facilities in geographic area requested by pt/family on  05/11/2014 FL2 transmitted to all facilities within larger geographic area on   Patient informed that his/her managed care company has contracts with or will negotiate with  certain facilities, including the following:     Patient/family informed of bed offers received:   [facility] Physician recommends and patient chooses bed at    [facility]  05/12/2014 Patient to be transferred to facility by ambulance Patient and family notified of transfer on 05/12/2014 Name of family member notified:  theresa/daughter  The following physician request were entered in Epic:   Additional  Comments:  .Elray Bubaegina Hiedi Touchton, LCSW [facility] Clinical Social Worker - Weekend Coverage cell #: [phone]

## 2014-05-15 ENCOUNTER — Non-Acute Institutional Stay (SKILLED_NURSING_FACILITY): Payer: Medicare Other | Admitting: Adult Health

## 2014-05-15 DIAGNOSIS — I10 Essential (primary) hypertension: Secondary | ICD-10-CM

## 2014-05-15 DIAGNOSIS — I482 Chronic atrial fibrillation, unspecified: Secondary | ICD-10-CM

## 2014-05-15 DIAGNOSIS — K5900 Constipation, unspecified: Secondary | ICD-10-CM | POA: Diagnosis not present

## 2014-05-15 DIAGNOSIS — R531 Weakness: Secondary | ICD-10-CM | POA: Diagnosis not present

## 2014-05-15 DIAGNOSIS — F0390 Unspecified dementia without behavioral disturbance: Secondary | ICD-10-CM

## 2014-05-15 DIAGNOSIS — N3281 Overactive bladder: Secondary | ICD-10-CM | POA: Diagnosis not present

## 2014-05-15 DIAGNOSIS — J189 Pneumonia, unspecified organism: Secondary | ICD-10-CM | POA: Diagnosis not present

## 2014-05-16 ENCOUNTER — Non-Acute Institutional Stay (SKILLED_NURSING_FACILITY): Payer: Medicare Other | Admitting: Internal Medicine

## 2014-05-16 DIAGNOSIS — R531 Weakness: Secondary | ICD-10-CM | POA: Diagnosis not present

## 2014-05-16 DIAGNOSIS — N3281 Overactive bladder: Secondary | ICD-10-CM

## 2014-05-16 DIAGNOSIS — F0390 Unspecified dementia without behavioral disturbance: Secondary | ICD-10-CM | POA: Diagnosis not present

## 2014-05-16 DIAGNOSIS — K5900 Constipation, unspecified: Secondary | ICD-10-CM | POA: Diagnosis not present

## 2014-05-16 DIAGNOSIS — N183 Chronic kidney disease, stage 3 unspecified: Secondary | ICD-10-CM

## 2014-05-16 DIAGNOSIS — J189 Pneumonia, unspecified organism: Secondary | ICD-10-CM | POA: Diagnosis not present

## 2014-05-16 DIAGNOSIS — I4891 Unspecified atrial fibrillation: Secondary | ICD-10-CM | POA: Diagnosis not present

## 2014-05-16 DIAGNOSIS — I10 Essential (primary) hypertension: Secondary | ICD-10-CM

## 2014-05-16 NOTE — Progress Notes (Signed)
Patient ID: Carolyn Sanders, female   DOB: 08/16/1925, 81 y.o.   MRN: [id number]     [facility]   PCP: Zoila ShutterWOODYEAR,WYNNE E, MD  Code Status: full code  No Known Allergies  Chief Complaint  Patient presents with  . New Admit To SNF     HPI:  81 year old patient is here for short term rehabilitation post hospital admission from 05/08/14-05/11/14 with AMS and weakness. She was diagnosed to have HCAP and acute on chronic renal failure and was started on vancomycin and cefepime. She was later switched to ceftin. She also responded well to iv fluids. She is seen in her room today. She has completed her antibiotics. She feels weak and tired today. She has PMH of dementia, chronic atrial fibrillation, hypertension, OAB and chronic kidney disease.   Review of Systems:  Constitutional: Negative for fever, chills, diaphoresis.  HENT: Negative for headache, congestion  Respiratory: Negative for shortness of breath and wheezing.  positive for cough Cardiovascular: Negative for chest pain, palpitations Gastrointestinal: Negative for heartburn, nausea, vomiting, abdominal pain Genitourinary: Negative for dysuria Musculoskeletal: Negative for back pain, falls Skin: Negative for itching, rash.  Neurological: Negative for dizziness Psychiatric/Behavioral: Negative for depression   Past Medical History  Diagnosis Date  . Stroke   . Coronary artery disease   . Atrial fibrillation   . Hypertension   . Dementia    No past surgical history on file. Social History:   reports that she has never smoked. She has never used smokeless tobacco. She reports that she does not drink alcohol or use illicit drugs.  Family History  Problem Relation Age of Onset  . Breast cancer Mother     Medications: Patient's Medications  New Prescriptions   No medications on file  Previous Medications   ACETAMINOPHEN (TYLENOL) 325 MG TABLET    Take 2 tablets (650 mg total) by  mouth every 6 (six) hours as needed for mild pain (or Fever >/= 101).   BISACODYL (DULCOLAX) 10 MG SUPPOSITORY    Place 1 suppository (10 mg total) rectally daily as needed for moderate constipation.   CALCIUM CARBONATE-VIT D-MIN (CALCIUM 600+D PLUS MINERALS) 600-400 MG-UNIT TABS    Take 1 tablet by mouth daily.   CEFUROXIME (CEFTIN) 500 MG TABLET    Take 1 tablet (500 mg total) by mouth 2 (two) times daily with a meal.   DABIGATRAN (PRADAXA) 75 MG CAPS    Take 75 mg by mouth every 12 (twelve) hours.     DILTIAZEM (DILACOR XR) 180 MG 24 HR CAPSULE    Take 180 mg by mouth daily.   DOCUSATE SODIUM (COLACE) 100 MG CAPSULE    Take 100 mg by mouth 2 (two) times daily.   HYDRALAZINE (APRESOLINE) 50 MG TABLET    Take 100 mg by mouth 2 (two) times daily.   MEMANTINE (NAMENDA XR) 28 MG CP24 24 HR CAPSULE    Take 28 mg by mouth daily.   METOPROLOL SUCCINATE (TOPROL-XL) 25 MG 24 HR TABLET    Take 25 mg by mouth daily.   MULTIPLE VITAMIN (MULTIVITAMIN WITH MINERALS) TABS TABLET    Take 1 tablet by mouth daily.   POLYETHYLENE GLYCOL (MIRALAX / GLYCOLAX) PACKET    Take 17 g by mouth 2 (two) times daily.   SENNA-DOCUSATE (SENOKOT-S) 8.6-50 MG PER TABLET    Take 1 tablet by mouth at bedtime.   SOLIFENACIN (VESICARE) 10 MG TABLET    Take 10 mg  by mouth daily.  Modified Medications   No medications on file  Discontinued Medications   No medications on file     Physical Exam: Filed Vitals:   05/16/14 1643  BP: 116/60  Pulse: 86  Temp: 97.3 F (36.3 C)  Resp: 18  Weight: 149 lb 9.6 oz (67.858 kg)  SpO2: 97%    General- elderly female, in no acute distress Head- normocephalic, atraumatic Throat- moist mucus membrane Eyes- no pallor, no icterus, no discharge, normal conjunctiva, normal sclera Neck- no cervical lymphadenopathy Cardiovascular- normal s1,s2, no murmurs, palpable dorsalis pedis, trace leg edema Respiratory- bilateral poor air entry, no wheeze, no rhonchi, no crackles, no use of  accessory muscles, on o2 Abdomen- bowel sounds present, soft, non tender Musculoskeletal- able to move all 4 extremities, generalized weakness Skin- warm and dry Psychiatry- alert and oriented   Labs reviewed: Basic Metabolic Panel:  Recent Labs  16/10/96 0445 05/10/14 0355 05/11/14 0400  NA 138 133* 139  K 4.2 3.9 4.3  CL 104 103 107  CO2 GLUCOSE 115* 105* 101*  BUN 29* 32* 25*  CREATININE 1.30* 1.48* 1.28*  CALCIUM 9.0 8.1* 8.4   Liver Function Tests:  Recent Labs  04/16/14 0725 05/09/14 0445  AST 17 24  ALT 10 10  ALKPHOS 74 66  BILITOT 0.5 0.7  PROT 6.2 6.4  ALBUMIN 3.7 3.8   No results for input(s): LIPASE, AMYLASE in the last 8760 hours. No results for input(s): AMMONIA in the last 8760 hours. CBC:  Recent Labs  04/16/14 0725 05/08/14 2046 05/09/14 0445 05/10/14 0355 05/11/14 0400  WBC 12.2* 7.4 5.3 7.1 5.4  NEUTROABS 9.6* 5.1 3.0  --   --   HGB 11.5* 13.4  14.6 12.3 12.0 11.4*  HCT 37.0 42.2  43.0 38.2 37.3 35.7*  MCV 97.9 96.8 96.2 96.4 97.8  PLT 178 170 141* 161 149*   Cardiac Enzymes: No results for input(s): CKTOTAL, CKMB, CKMBINDEX, TROPONINI in the last 8760 hours. BNP: Invalid input(s): POCBNP CBG:  Recent Labs  04/17/14 0744  GLUCAP 107*    Assessment/Plan  Generalized weakness  Will have her work with physical therapy and occupational therapy team to help with gait training and muscle strengthening exercises.fall precautions. Skin care. Encourage to be out of bed.   HCAP Completed her ceftin, breathing stable. Continue o2. Monitor clinically  Constipation Stable. continue Colace 100 mg bid, miralax bid and senna s daily. Hydration encouraged  Dementia Stable, continue Namenda xr 28 mg daily  Chronic atrial fibrillation rate controlled. Continue toprol 25 mg daily with diltiazem 180 mg daily for rate control. continue pradaxa for secondary stroke prevention  Acute on chronic renal failure Responded well to  iv fluids, monitor renal function  Hypertension  Stable. continue Toprol, Apresoline and monitor bp  Overactive bladder continue oxybutynin 10 mg daily   Goals of care:  Short-term rehabilitation   Family/ staff Communication: reviewed care plan with nursing supervisor    Oneal Grout, MD  [facility] [phone] (Monday-Friday 8 am - 5 pm) [phone] (afterhours)

## 2014-06-06 ENCOUNTER — Encounter: Payer: Self-pay | Admitting: Adult Health

## 2014-06-06 ENCOUNTER — Non-Acute Institutional Stay (SKILLED_NURSING_FACILITY): Payer: Medicare Other | Admitting: Adult Health

## 2014-06-06 DIAGNOSIS — I482 Chronic atrial fibrillation, unspecified: Secondary | ICD-10-CM

## 2014-06-06 DIAGNOSIS — F0390 Unspecified dementia without behavioral disturbance: Secondary | ICD-10-CM | POA: Diagnosis not present

## 2014-06-06 DIAGNOSIS — J189 Pneumonia, unspecified organism: Secondary | ICD-10-CM

## 2014-06-06 DIAGNOSIS — N3281 Overactive bladder: Secondary | ICD-10-CM

## 2014-06-06 DIAGNOSIS — K5900 Constipation, unspecified: Secondary | ICD-10-CM

## 2014-06-06 DIAGNOSIS — R531 Weakness: Secondary | ICD-10-CM | POA: Diagnosis not present

## 2014-06-06 DIAGNOSIS — I10 Essential (primary) hypertension: Secondary | ICD-10-CM

## 2014-06-06 NOTE — Progress Notes (Signed)
Patient ID: Carolyn Sanders, female   DOB: 05/08/1925, 81 y.o.   MRN: [id number]   05/15/14  Facility:  Nursing Home Location:  [facility] Room Number: 1007-P LEVEL OF CARE:  SNF (31)   Chief Complaint  Patient presents with  . Hospitalization Follow-up    Generalized weakness,  HCAP, dementia, chronic atrial fibrillation, hypertension, constipation and overactive bladder    HISTORY OF PRESENT ILLNESS:  This is an 81 year old female who has [facility] 05[facility]14 [facility]. Has PMH of dementia, chronic atrial fibrillation, hypertension and chronic kidney disease. She presented to the ER with increasing confusion, weakness and difficulty to walk. Chest x-ray showed possible infiltrates. She was hospitalized 3 weeks ago. She was diagnosed with HCAP and treated with vancomycin and cefepime. She was discharged with Ceftin PO. She was also found to have CKD stage III with creatinine of 1.48 and was given 1 L IV fluids. Latest creatinine is 1.28.  She has been admitted for a short-term rehabilitation.  PAST MEDICAL HISTORY:  Past Medical History  Diagnosis Date  . Stroke   . Coronary artery disease   . Atrial fibrillation   . Hypertension   . Dementia     CURRENT MEDICATIONS: Reviewed per MAR/see medication list  No Known Allergies   REVIEW OF SYSTEMS:  GENERAL: no change in appetite, no fatigue, no weight changes, no fever, chills or weakness RESPIRATORY: no cough, SOB, DOE, wheezing, hemoptysis CARDIAC: no chest pain, edema or palpitations GI: no abdominal pain, diarrhea, constipation, heart burn, nausea or vomiting  PHYSICAL EXAMINATION  GENERAL: no acute distress, normal body habitus EYES: conjunctivae normal, sclerae normal, normal eye lids NECK: supple, trachea midline, no neck masses, no thyroid tenderness, no thyromegaly LYMPHATICS: no LAN in the neck, no supraclavicular LAN RESPIRATORY: breathing is  even & unlabored, BS CTAB CARDIAC: RRR, no murmur,no extra heart sounds, no edema GI: abdomen soft, normal BS, no masses, no tenderness, no hepatomegaly, no splenomegaly EXTREMITIES:  Able to move 4 extremities PSYCHIATRIC: the patient is alert & oriented to person, affect & behavior appropriate  LABS/RADIOLOGY: Labs reviewed: Basic Metabolic Panel:  Recent Labs  14/78/2901/04/24 0445 05/10/14 0355 05/11/14 0400  NA 138 133* 139  K 4.2 3.9 4.3  CL 104 103 107  CO2 27 24 27   GLUCOSE 115* 105* 101*  BUN 29* 32* 25*  CREATININE 1.30* 1.48* 1.28*  CALCIUM 9.0 8.1* 8.4   Liver Function Tests:  Recent Labs  04/16/14 0725 05/09/14 0445  AST 17 24  ALT 10 10  ALKPHOS 74 66  BILITOT 0.5 0.7  PROT 6.2 6.4  ALBUMIN 3.7 3.8   CBC:  Recent Labs  04/16/14 0725 05/08/14 2046 05/09/14 0445 05/10/14 0355 05/11/14 0400  WBC 12.2* 7.4 5.3 7.1 5.4  NEUTROABS 9.6* 5.1 3.0  --   --   HGB 11.5* 13.4  14.6 12.3 12.0 11.4*  HCT 37.0 42.2  43.0 38.2 37.3 35.7*  MCV 97.9 96.8 96.2 96.4 97.8  PLT 178 170 141* 161 149*   CBG:  Recent Labs  04/17/14 0744  GLUCAP 107*    Dg Chest 2 View  05/08/2014   CLINICAL DATA:  Acute onset of generalized weakness. Initial encounter.  EXAM: CHEST  2 VIEW  COMPARISON:  Chest radiograph performed 04/15/2014  FINDINGS: The lungs are well-aerated. Mild patchy bilateral airspace opacities may reflect mild pneumonia or interstitial edema, with underlying vascular congestion. No pleural effusion or  pneumothorax is seen.  The heart is mildly enlarged. A relatively large hiatal hernia is suggested. No acute osseous abnormalities are seen. Postoperative change is seen overlying the right axilla.  IMPRESSION: 1. Mild patchy bilateral airspace opacities may reflect mild pneumonia or interstitial edema, with underlying vascular congestion. 2. Mild cardiomegaly. 3. Large hiatal hernia again noted.   Electronically Signed   By: Roanna Raider M.D.   On: 05/08/2014  21:52   Ct Head Wo Contrast  05/09/2014   CLINICAL DATA:  Confusion.  History of Alzheimer's disease  EXAM: CT HEAD WITHOUT CONTRAST  TECHNIQUE: Contiguous axial images were obtained from the base of the skull through the vertex without intravenous contrast.  COMPARISON:  11/11/2012  FINDINGS: Skull and Sinuses:Negative for fracture or destructive process. The mastoids, middle ears, and imaged paranasal sinuses are clear.  Orbits: No acute abnormality.  Brain: No evidence of acute infarction, hemorrhage, hydrocephalus, or mass lesion/mass effect. Brain atrophy correlating with history of dementia. There is chronic small vessel disease with patchy cerebral white matter ischemic gliosis. Remote lateral lenticulostriate infarct on the right, primarily affecting the caudate head and anterior putamen.  IMPRESSION: 1. No acute intracranial findings. 2. Atrophy and chronic ischemia, as above.   Electronically Signed   By: Marnee Spring M.D.   On: 05/09/2014 02:54   Dg Abd 2 Views  05/08/2014   CLINICAL DATA:  Acute onset of abdominal distention and constipation for 2 days. Initial encounter.  EXAM: ABDOMEN - 2 VIEW  COMPARISON:  Abdominal radiograph performed 04/18/2014  FINDINGS: The visualized bowel gas pattern is unremarkable. Scattered air and stool filled loops of colon are seen; no abnormal dilatation of small bowel loops is seen to suggest small bowel obstruction. No free intra-abdominal air is identified on the provided decubitus view. The rectum is largely filled with air.  The visualized osseous structures are within normal limits; the sacroiliac joints are unremarkable in appearance. The visualized lung bases are essentially clear.  IMPRESSION: Unremarkable bowel gas pattern; no free intra-abdominal air seen. Moderate amount of stool noted in the colon. The rectum is largely filled with air.   Electronically Signed   By: Roanna Raider M.D.   On: 05/08/2014 21:54    ASSESSMENT/PLAN:  Generalized  weakness - for rehabilitation HCAP -  continue Ceftin 500 mg by mouth twice a day 5 days Dementia - continue Namenda At 28 mg 1 tab by mouth daily Chronic atrial fibrillation - rate controlled; continue Pradaxa 75 mg by mouth every 12 hours, Toprol-XL 25 mg by mouth daily and diltiazem 180 mg 24 hour by mouth daily Hypertension - well controlled; continue Toprol, diltiazem and Apresoline 50 mg 2 tabs = 100 mg by mouth twice a day Constipation - continue Colace 100 mg by mouth twice a day, MiraLAX 17 g twice a day and senna S1 by mouth daily at bedtime Overactive bladder - continue oxybutynin 10 mg by mouth daily   Goals of care:  Short-term rehabilitation   Labs/test ordered:  none  Spent 50 minutes in patient care.    [facility], NP [employer] [phone]

## 2014-06-06 NOTE — Progress Notes (Signed)
Patient ID: Carolyn PaulaMarleen A Pae, female   DOB: 12/30/1925, 81 y.o.   MRN: [id number]   06/06/14  Facility:  Nursing Home Location:  [facility] Room Number: 1007-P LEVEL OF CARE:  SNF (31)   Chief Complaint  Patient presents with  . Discharge Note    Generalized weakness, HCAP, dementia, chronic atrial fibrillation, hypertension, constipation and overactive bladder    HISTORY OF PRESENT ILLNESS:  This is an 81 year old female who is for discharge home with home health PT for strengthening and safety balance/gait and OT for self care.  She has been [facility] 05[facility]12[facility]14 from [facility]. Has PMH of dementia, chronic atrial fibrillation, hypertension and chronic kidney disease. She presented to the ER with increasing confusion, weakness and difficulty to walk. Chest x-ray showed possible infiltrates. She was hospitalized 3 weeks ago. She was diagnosed with HCAP and treated with vancomycin and cefepime. She was discharged with Ceftin PO. She was also found to have CKD stage III with creatinine of 1.48 and was given 1 L IV fluids. Latest creatinine is 1.28.  Patient was admitted to this facility for short-term rehabilitation after the patient's recent hospitalization.  Patient has completed SNF rehabilitation and therapy has cleared the patient for discharge.  PAST MEDICAL HISTORY:  Past Medical History  Diagnosis Date  . Stroke   . Coronary artery disease   . Atrial fibrillation   . Hypertension   . Dementia     CURRENT MEDICATIONS: Reviewed per MAR/see medication list  No Known Allergies   REVIEW OF SYSTEMS:  GENERAL: no change in appetite, no fatigue, no weight changes, no fever, chills or weakness RESPIRATORY: no cough, SOB, DOE, wheezing, hemoptysis CARDIAC: no chest pain, edema or palpitations GI: no abdominal pain, diarrhea, constipation, heart burn, nausea or vomiting  PHYSICAL EXAMINATION  GENERAL: no acute distress,  normal body habitus NECK: supple, trachea midline, no neck masses, no thyroid tenderness, no thyromegaly LYMPHATICS: no LAN in the neck, no supraclavicular LAN RESPIRATORY: breathing is even & unlabored, BS CTAB CARDIAC: RRR, no murmur,no extra heart sounds, no edema GI: abdomen soft, normal BS, no masses, no tenderness, no hepatomegaly, no splenomegaly EXTREMITIES:  Able to move 4 extremities PSYCHIATRIC: the patient is alert & oriented to person, affect & behavior appropriate  LABS/RADIOLOGY: Labs reviewed: Basic Metabolic Panel:  Recent Labs  40/34/7401/04/24 0445 05/10/14 0355 05/11/14 0400  NA 138 133* 139  K 4.2 3.9 4.3  CL 104 103 107  CO2 27 24 27   GLUCOSE 115* 105* 101*  BUN 29* 32* 25*  CREATININE 1.30* 1.48* 1.28*  CALCIUM 9.0 8.1* 8.4   Liver Function Tests:  Recent Labs  04/16/14 0725 05/09/14 0445  AST 17 24  ALT 10 10  ALKPHOS 74 66  BILITOT 0.5 0.7  PROT 6.2 6.4  ALBUMIN 3.7 3.8   CBC:  Recent Labs  04/16/14 0725 05/08/14 2046 05/09/14 0445 05/10/14 0355 05/11/14 0400  WBC 12.2* 7.4 5.3 7.1 5.4  NEUTROABS 9.6* 5.1 3.0  --   --   HGB 11.5* 13.4  14.6 12.3 12.0 11.4*  HCT 37.0 42.2  43.0 38.2 37.3 35.7*  MCV 97.9 96.8 96.2 96.4 97.8  PLT 178 170 141* 161 149*   CBG:  Recent Labs  04/17/14 0744  GLUCAP 107*    Dg Chest 2 View  05/08/2014   CLINICAL DATA:  Acute onset of generalized weakness. Initial encounter.  EXAM: CHEST  2 VIEW  COMPARISON:  Chest radiograph performed 04/15/2014  FINDINGS: The lungs are well-aerated. Mild patchy bilateral airspace opacities may reflect mild pneumonia or interstitial edema, with underlying vascular congestion. No pleural effusion or pneumothorax is seen.  The heart is mildly enlarged. A relatively large hiatal hernia is suggested. No acute osseous abnormalities are seen. Postoperative change is seen overlying the right axilla.  IMPRESSION: 1. Mild patchy bilateral airspace opacities may reflect mild pneumonia  or interstitial edema, with underlying vascular congestion. 2. Mild cardiomegaly. 3. Large hiatal hernia again noted.   Electronically Signed   By: Roanna Raider M.D.   On: 05/08/2014 21:52   Ct Head Wo Contrast  05/09/2014   CLINICAL DATA:  Confusion.  History of Alzheimer's disease  EXAM: CT HEAD WITHOUT CONTRAST  TECHNIQUE: Contiguous axial images were obtained from the base of the skull through the vertex without intravenous contrast.  COMPARISON:  11/11/2012  FINDINGS: Skull and Sinuses:Negative for fracture or destructive process. The mastoids, middle ears, and imaged paranasal sinuses are clear.  Orbits: No acute abnormality.  Brain: No evidence of acute infarction, hemorrhage, hydrocephalus, or mass lesion/mass effect. Brain atrophy correlating with history of dementia. There is chronic small vessel disease with patchy cerebral white matter ischemic gliosis. Remote lateral lenticulostriate infarct on the right, primarily affecting the caudate head and anterior putamen.  IMPRESSION: 1. No acute intracranial findings. 2. Atrophy and chronic ischemia, as above.   Electronically Signed   By: Marnee Spring M.D.   On: 05/09/2014 02:54   Dg Abd 2 Views  05/08/2014   CLINICAL DATA:  Acute onset of abdominal distention and constipation for 2 days. Initial encounter.  EXAM: ABDOMEN - 2 VIEW  COMPARISON:  Abdominal radiograph performed 04/18/2014  FINDINGS: The visualized bowel gas pattern is unremarkable. Scattered air and stool filled loops of colon are seen; no abnormal dilatation of small bowel loops is seen to suggest small bowel obstruction. No free intra-abdominal air is identified on the provided decubitus view. The rectum is largely filled with air.  The visualized osseous structures are within normal limits; the sacroiliac joints are unremarkable in appearance. The visualized lung bases are essentially clear.  IMPRESSION: Unremarkable bowel gas pattern; no free intra-abdominal air seen. Moderate amount  of stool noted in the colon. The rectum is largely filled with air.   Electronically Signed   By: Roanna Raider M.D.   On: 05/08/2014 21:54    ASSESSMENT/PLAN:  Generalized weakness - for home health PT and OT HCAP -  resolved Dementia - continue Namenda XR 28 mg 1 tab by mouth daily Chronic atrial fibrillation - rate controlled; continue Pradaxa 75 mg by mouth every 12 hours, Toprol-XL 25 mg by mouth daily and diltiazem 180 mg 24 hour by mouth daily Hypertension - well controlled; continue Toprol, diltiazem and Apresoline 50 mg 2 tabs = 100 mg by mouth twice a day Constipation - continue Colace 100 mg by mouth twice a day, MiraLAX 17 g twice a day and senna S1 by mouth daily at bedtime Overactive bladder - continue oxybutynin 10 mg by mouth daily   I have filled out patient's discharge paperwork and written prescriptions.  Patient will receive home health PT and OT.  Total discharge time: Less than 30 minutes  Discharge time involved coordination of the discharge process with Child psychotherapist, nursing staff and therapy department. Medical justification for home health services verified.    [facility], NP [employer] [phone]

## 2014-06-12 DIAGNOSIS — R5381 Other malaise: Secondary | ICD-10-CM

## 2014-06-12 DIAGNOSIS — I4891 Unspecified atrial fibrillation: Secondary | ICD-10-CM | POA: Diagnosis not present

## 2014-06-12 DIAGNOSIS — N183 Chronic kidney disease, stage 3 (moderate): Secondary | ICD-10-CM | POA: Diagnosis not present

## 2014-06-12 DIAGNOSIS — R269 Unspecified abnormalities of gait and mobility: Secondary | ICD-10-CM

## 2014-09-03 ENCOUNTER — Encounter (HOSPITAL_COMMUNITY): Payer: Self-pay | Admitting: Emergency Medicine

## 2014-09-03 ENCOUNTER — Emergency Department (HOSPITAL_COMMUNITY)
Admission: EM | Admit: 2014-09-03 | Discharge: 2014-09-03 | Disposition: A | Discharge status: Home / Self Care | Payer: Medicare Other | Attending: Emergency Medicine | Admitting: Emergency Medicine

## 2014-09-03 ENCOUNTER — Other Ambulatory Visit: Payer: Self-pay

## 2014-09-03 ENCOUNTER — Emergency Department (HOSPITAL_COMMUNITY): Payer: Medicare Other

## 2014-09-03 DIAGNOSIS — R05 Cough: Secondary | ICD-10-CM | POA: Diagnosis present

## 2014-09-03 DIAGNOSIS — F0390 Unspecified dementia without behavioral disturbance: Secondary | ICD-10-CM | POA: Diagnosis not present

## 2014-09-03 DIAGNOSIS — I10 Essential (primary) hypertension: Secondary | ICD-10-CM | POA: Diagnosis not present

## 2014-09-03 DIAGNOSIS — K449 Diaphragmatic hernia without obstruction or gangrene: Secondary | ICD-10-CM | POA: Insufficient documentation

## 2014-09-03 DIAGNOSIS — I4891 Unspecified atrial fibrillation: Secondary | ICD-10-CM | POA: Diagnosis not present

## 2014-09-03 DIAGNOSIS — Z79899 Other long term (current) drug therapy: Secondary | ICD-10-CM | POA: Insufficient documentation

## 2014-09-03 DIAGNOSIS — R600 Localized edema: Secondary | ICD-10-CM | POA: Insufficient documentation

## 2014-09-03 DIAGNOSIS — Z8673 Personal history of transient ischemic attack (TIA), and cerebral infarction without residual deficits: Secondary | ICD-10-CM | POA: Insufficient documentation

## 2014-09-03 DIAGNOSIS — I2510 Atherosclerotic heart disease of native coronary artery without angina pectoris: Secondary | ICD-10-CM | POA: Diagnosis not present

## 2014-09-03 DIAGNOSIS — R0602 Shortness of breath: Secondary | ICD-10-CM | POA: Insufficient documentation

## 2014-09-03 LAB — CBC WITH DIFFERENTIAL/PLATELET
BASOS PCT: 1 % (ref 0–1)
Basophils Absolute: 0.1 10*3/uL (ref 0.0–0.1)
Eosinophils Absolute: 0.1 10*3/uL (ref 0.0–0.7)
Eosinophils Relative: 2 % (ref 0–5)
HCT: 36.6 % (ref 36.0–46.0)
Hemoglobin: 11.6 g/dL — ABNORMAL LOW (ref 12.0–15.0)
LYMPHS ABS: 1.8 10*3/uL (ref 0.7–4.0)
Lymphocytes Relative: 20 % (ref 12–46)
MCH: 30.7 pg (ref 26.0–34.0)
MCHC: 31.7 g/dL (ref 30.0–36.0)
MCV: 96.8 fL (ref 78.0–100.0)
Monocytes Absolute: 0.6 10*3/uL (ref 0.1–1.0)
Monocytes Relative: 7 % (ref 3–12)
Neutro Abs: 6.6 10*3/uL (ref 1.7–7.7)
Neutrophils Relative %: 72 % (ref 43–77)
Platelets: 214 10*3/uL (ref 150–400)
RBC: 3.78 MIL/uL — AB (ref 3.87–5.11)
RDW: 14 % (ref 11.5–15.5)
WBC: 9.2 10*3/uL (ref 4.0–10.5)

## 2014-09-03 LAB — URINALYSIS, ROUTINE W REFLEX MICROSCOPIC
Bilirubin Urine: NEGATIVE
Glucose, UA: NEGATIVE mg/dL
Hgb urine dipstick: NEGATIVE
Ketones, ur: NEGATIVE mg/dL
LEUKOCYTES UA: NEGATIVE
Nitrite: NEGATIVE
Protein, ur: NEGATIVE mg/dL
SPECIFIC GRAVITY, URINE: 1.01 (ref 1.005–1.030)
Urobilinogen, UA: 0.2 mg/dL (ref 0.0–1.0)
pH: 5.5 (ref 5.0–8.0)

## 2014-09-03 LAB — BASIC METABOLIC PANEL
Anion gap: 12 (ref 5–15)
BUN: 30 mg/dL — ABNORMAL HIGH (ref 6–20)
CO2: 27 mmol/L (ref 22–32)
CREATININE: 1.35 mg/dL — AB (ref 0.44–1.00)
Calcium: 9 mg/dL (ref 8.9–10.3)
Chloride: 103 mmol/L (ref 101–111)
GFR calc Af Amer: 40 mL/min — ABNORMAL LOW (ref >60)
GFR calc non Af Amer: 34 mL/min — ABNORMAL LOW (ref >60)
GLUCOSE: 139 mg/dL — AB (ref 65–99)
Potassium: 3.9 mmol/L (ref 3.5–5.1)
SODIUM: 142 mmol/L (ref 135–145)

## 2014-09-03 LAB — BRAIN NATRIURETIC PEPTIDE: B NATRIURETIC PEPTIDE 5: 280.6 pg/mL — AB (ref 0.0–100.0)

## 2014-09-03 LAB — I-STAT TROPONIN, ED: Troponin i, poc: 0.01 ng/mL (ref 0.00–0.08)

## 2014-09-03 MED ORDER — IPRATROPIUM-ALBUTEROL 0.5-2.5 (3) MG/3ML IN SOLN
3.0000 mL | Freq: Once | RESPIRATORY_TRACT | Status: AC
  Administered 2014-09-03: 3 mL via RESPIRATORY_TRACT
  Filled 2014-09-03: qty 3

## 2014-09-03 NOTE — ED Provider Notes (Signed)
CSN: [id number]     Arrival date & time 09/03/14  0033 History   First MD Initiated Contact with Patient 09/03/14 0222     Chief Complaint  Patient presents with  . Cough  . Shortness of Breath     (Consider location/radiation/quality/duration/timing/severity/associated sxs/prior Treatment) HPI Comments: Pt comes in with cc of cough and shortness of breath. Pt has hx of hiatal henia, stroke, afib.  Pt here with family. They report that pt was trying tosleep, and she started choking, coughing and was short of breath. Coughing was severe, pt was gasping for air. No hx of CHF. PT has OSA and needs O2 at night. No fevers.  Also, family has noticed some increased leg swelling over the past couple of days. Pt is on pradaxa for for afib, there is no hx of DVT, PE and no risk factors. There is no hx of CHF. No orthopnea, PND.   ROS 10 Systems reviewed and are negative for acute change except as noted in the HPI.     Patient is a 81 y.o. female presenting with cough and shortness of breath. The history is provided by the patient.  Cough Associated symptoms: shortness of breath   Shortness of Breath Associated symptoms: cough     Past Medical History  Diagnosis Date  . Stroke   . Coronary artery disease   . Atrial fibrillation   . Hypertension   . Dementia    History reviewed. No pertinent past surgical history. Family History  Problem Relation Age of Onset  . Breast cancer Mother    History  Substance Use Topics  . Smoking status: Never Smoker   . Smokeless tobacco: Never Used  . Alcohol Use: No   OB History    No data available     Review of Systems  Respiratory: Positive for cough and shortness of breath.   All other systems reviewed and are negative.     Allergies  Review of patient's allergies indicates no known allergies.  Home Medications   Prior to Admission medications   Medication Sig Start Date End Date Taking? Authorizing Provider  dabigatran  (PRADAXA) 75 MG CAPS Take 75 mg by mouth every 12 (twelve) hours.     Yes Historical Provider, MD  diltiazem (DILACOR XR) 180 MG 24 hr capsule Take 180 mg by mouth daily.   Yes Historical Provider, MD  hydrALAZINE (APRESOLINE) 50 MG tablet Take 100 mg by mouth 2 (two) times daily.   Yes Historical Provider, MD  memantine (NAMENDA XR) 28 MG CP24 24 hr capsule Take 28 mg by mouth daily.   Yes Historical Provider, MD  metoprolol (LOPRESSOR) 50 MG tablet Take 50 mg by mouth 2 (two) times daily.   Yes Historical Provider, MD  Multiple Vitamin (MULTIVITAMIN WITH MINERALS) TABS tablet Take 1 tablet by mouth daily.   Yes Historical Provider, MD  pantoprazole (PROTONIX) 40 MG tablet Take 40 mg by mouth daily.   Yes Historical Provider, MD  simvastatin (ZOCOR) 10 MG tablet Take 10 mg by mouth daily.   Yes Historical Provider, MD  solifenacin (VESICARE) 10 MG tablet Take 10 mg by mouth daily.   Yes Historical Provider, MD  acetaminophen (TYLENOL) 325 MG tablet Take 2 tablets (650 mg total) by mouth every 6 (six) hours as needed for mild pain (or Fever >/= 101). 05/11/14   Jeralyn Bennett, MD  bisacodyl (DULCOLAX) 10 MG suppository Place 1 suppository (10 mg total) rectally daily as needed for moderate constipation. Patient  not taking: Reported on 05/08/2014 04/18/14   Calvert Cantor, MD  Calcium Carbonate-Vit D-Min (CALCIUM 600+D PLUS MINERALS) 600-400 MG-UNIT TABS Take 1 tablet by mouth daily.    Historical Provider, MD  cefUROXime (CEFTIN) 500 MG tablet Take 1 tablet (500 mg total) by mouth 2 (two) times daily with a meal. Patient not taking: Reported on 09/03/2014 05/11/14   Jeralyn Bennett, MD  docusate sodium (COLACE) 100 MG capsule Take 100 mg by mouth 2 (two) times daily.    Historical Provider, MD  metoprolol succinate (TOPROL-XL) 25 MG 24 hr tablet Take 25 mg by mouth daily.    Historical Provider, MD  polyethylene glycol (MIRALAX / GLYCOLAX) packet Take 17 g by mouth 2 (two) times daily. Patient not taking:  Reported on 05/08/2014 04/18/14   Calvert Cantor, MD  senna-docusate (SENOKOT-S) 8.6-50 MG per tablet Take 1 tablet by mouth at bedtime. Patient not taking: Reported on 05/08/2014 04/18/14   Calvert Cantor, MD   BP 165/72 mmHg  Pulse 109  Temp(Src) 99.1 F (37.3 C) (Oral)  Resp 20  SpO2 93% Physical Exam  Constitutional: She is oriented to person, place, and time. She appears well-developed and well-nourished.  HENT:  Head: Normocephalic and atraumatic.  Eyes: EOM are normal. Pupils are equal, round, and reactive to light.  Neck: Neck supple.  Cardiovascular: Normal rate, regular rhythm and normal heart sounds.   No murmur heard. Pulmonary/Chest: Effort normal. No respiratory distress.  Abdominal: Soft. She exhibits no distension. There is no tenderness. There is no rebound and no guarding.  Musculoskeletal: She exhibits edema. She exhibits no tenderness.  Neurological: She is alert and oriented to person, place, and time.  Skin: Skin is warm and dry.  Nursing note and vitals reviewed.   ED Course  Procedures (including critical care time) Labs Review Labs Reviewed  CBC WITH DIFFERENTIAL/PLATELET - Abnormal; Notable for the following:    RBC 3.78 (*)    Hemoglobin 11.6 (*)    All other components within normal limits  BASIC METABOLIC PANEL - Abnormal; Notable for the following:    Glucose, Bld 139 (*)    BUN 30 (*)    Creatinine, Ser 1.35 (*)    GFR calc non Af Amer 34 (*)    GFR calc Af Amer 40 (*)    All other components within normal limits  BRAIN NATRIURETIC PEPTIDE - Abnormal; Notable for the following:    B Natriuretic Peptide 280.6 (*)    All other components within normal limits  URINALYSIS, ROUTINE W REFLEX MICROSCOPIC (NOT AT [facility])  Rosezena Sensor, ED    Imaging Review Dg Chest 2 View  09/03/2014   CLINICAL DATA:  Cough and dyspnea periodically, worsened tonight.  EXAM: CHEST  2 VIEW  COMPARISON:  08/17/2014  FINDINGS: There is a large hiatal hernia. There is mild  vascular fullness which is unchanged from 08/17/2014 and this is probably chronic as it also is visible on numerous prior chest radiographs. No acute infiltrate or acute CHF is evident. There are no effusions.  IMPRESSION: Large hiatal hernia. Chronic-appearing vascular prominence. No conclusive acute cardiopulmonary findings.   Electronically Signed   By: Ellery Plunk M.D.   On: 09/03/2014 02:31   Dg Abd 1 View  09/03/2014   CLINICAL DATA:  Periodic cough and dyspnea, much worse tonight. Question bowel obstruction  EXAM: ABDOMEN - 1 VIEW  COMPARISON:  05/08/2014  FINDINGS: There is a large volume of stool throughout the colon. There is no evidence of  a bowel obstruction. There is no extraluminal air.  IMPRESSION: Large volume colonic stool. Negative for obstruction or perforation.   Electronically Signed   By: Ellery Plunk M.D.   On: 09/03/2014 04:49     EKG Interpretation None      MDM   Final diagnoses:  Hiatal hernia  Bilateral lower extremity edema    Pt has hiatal hernia, and it appears that her sx of coughing when supine, gagging and dib - which have all resolved now, are likely from that. Xrays show no infection, no pulm edema. There is new leg swelling bilaterally, L worse than R slightly - she is on pradaxa, and pretest probability for DVT is extremely low due to the meds on board and bilateral nature. ? Right sided CHF vs. Venous stasis - favoring the latter, and asked to put compression stockings on.     Derwood Kaplan, MD 09/03/14 [phone]

## 2014-09-03 NOTE — Discharge Instructions (Signed)
We saw you in the ER for the shortness of breath, cough. All the results in the ER are normal - except - THERE IS CONSTIPATOiN AND THERE HERNIA.  The workup in the ER is not complete, and is limited to screening for life threatening and emergent conditions only, so please see a primary care doctor for further evaluation. See the Surgery doctors for the hernia to see if there is any treatment options.   Hiatal Hernia A hiatal hernia occurs when part of your stomach slides above the muscle that separates your abdomen from your chest (diaphragm). You can be born with a hiatal hernia (congenital), or it may develop over time. In almost all cases of hiatal hernia, only the top part of the stomach pushes through.  Many people have a hiatal hernia with no symptoms. The larger the hernia, the more likely that you will have symptoms. In some cases, a hiatal hernia allows stomach acid to flow back into the tube that carries food from your mouth to your stomach (esophagus). This may cause heartburn symptoms. Severe heartburn symptoms may mean you have developed a condition called gastroesophageal reflux disease (GERD).  CAUSES  Hiatal hernias are caused by a weakness in the opening (hiatus) where your esophagus passes through your diaphragm to attach to the upper part of your stomach. You may be born with a weakness in your hiatus, or a weakness can develop. RISK FACTORS Older age is a major risk factor for a hiatal hernia. Anything that increases pressure on your diaphragm can also increase your risk of a hiatal hernia. This includes:  Pregnancy.  Excess weight.  Frequent constipation. SIGNS AND SYMPTOMS  People with a hiatal hernia often have no symptoms. If symptoms develop, they are almost always caused by GERD. They may include:  Heartburn.  Belching.  Indigestion.  Trouble swallowing.  Coughing or wheezing.  Sore throat.  Hoarseness.  Chest pain. DIAGNOSIS  A hiatal hernia is  sometimes found during an exam for another problem. Your health care provider may suspect a hiatal hernia if you have symptoms of GERD. Tests may be done to diagnose GERD. These may include:  X-rays of your stomach or chest.  An upper gastrointestinal (GI) series. This is an X-ray exam of your GI tract involving the use of a chalky liquid that you swallow. The liquid shows up clearly on the X-ray.  Endoscopy. This is a procedure to look into your stomach using a thin, flexible tube that has a tiny camera and light on the end of it. TREATMENT  If you have no symptoms, you may not need treatment. If you have symptoms, treatment may include:  Dietary and lifestyle changes to help reduce GERD symptoms.  Medicines. These may include:  Over-the-counter antacids.  Medicines that make your stomach empty more quickly.  Medicines that block the production of stomach acid (H2 blockers).  Stronger medicines to reduce stomach acid (proton pump inhibitors).  You may need surgery to repair the hernia if other treatments are not helping. HOME CARE INSTRUCTIONS   Take all medicines as directed by your health care provider.  Quit smoking, if you smoke.  Try to achieve and maintain a healthy body weight.  Eat frequent small meals instead of three large meals a day. This keeps your stomach from getting too full.  Eat slowly.  Do not lie down right after eating.  Do noteat 1-2 hours before bed.   Do not drink beverages with caffeine. These include cola, coffee,  cocoa, and tea.  Do not drink alcohol.  Avoid foods that can make symptoms of GERD worse. These may include:  Fatty foods.  Citrus fruits.  Other foods and drinks that contain acid.  Avoid putting pressure on your belly. Anything that puts pressure on your belly increases the amount of acid that may be pushed up into your esophagus.   Avoid bending over, especially after eating.  Raise the head of your bed by putting  blocks under the legs. This keeps your head and esophagus higher than your stomach.  Do not wear tight clothing around your chest or stomach.  Try not to strain when having a bowel movement, when urinating, or when lifting heavy objects. SEEK MEDICAL CARE IF:  Your symptoms are not controlled with medicines or lifestyle changes.  You are having trouble swallowing.  You have coughing or wheezing that will not go away. SEEK IMMEDIATE MEDICAL CARE IF:  Your pain is getting worse.  Your pain spreads to your arms, neck, jaw, teeth, or back.  You have shortness of breath.  You sweat for no reason.  You feel sick to your stomach (nauseous) or vomit.  You vomit blood.  You have bright red blood in your stools.  You have black, tarry stools.  Document Released: 05/16/2003 Document Revised: 07/10/2013 Document Reviewed: 02/10/2013 [facility] Patient Information 2015 [place], [place]. This information is not intended to replace advice given to you by your health care provider. Make sure you discuss any questions you have with your health care provider.  Peripheral Edema You have swelling in your legs (peripheral edema). This swelling is due to excess accumulation of salt and water in your body. Edema may be a sign of heart, kidney or liver disease, or a side effect of a medication. It may also be due to problems in the leg veins. Elevating your legs and using special support stockings may be very helpful, if the cause of the swelling is due to poor venous circulation. Avoid long periods of standing, whatever the cause. Treatment of edema depends on identifying the cause. Chips, pretzels, pickles and other salty foods should be avoided. Restricting salt in your diet is almost always needed. Water pills (diuretics) are often used to remove the excess salt and water from your body via urine. These medicines prevent the kidney from reabsorbing sodium. This increases urine flow. Diuretic treatment  may also result in lowering of potassium levels in your body. Potassium supplements may be needed if you have to use diuretics daily. Daily weights can help you keep track of your progress in clearing your edema. You should call your caregiver for follow up care as recommended. SEEK IMMEDIATE MEDICAL CARE IF:   You have increased swelling, pain, redness, or heat in your legs.  You develop shortness of breath, especially when lying down.  You develop chest or abdominal pain, weakness, or fainting.  You have a fever. Document Released: 04/02/2004 Document Revised: 05/18/2011 Document Reviewed: 03/13/2009 [facility] Patient Information 2015 [place], [place]. This information is not intended to replace advice given to you by your health care provider. Make sure you discuss any questions you have with your health care provider.

## 2014-09-03 NOTE — ED Notes (Signed)
Bed: WA11 Expected date:  Expected time:  Means of arrival:  Comments: 

## 2014-09-03 NOTE — ED Notes (Signed)
Family states that pt has had cough and sob periodically that she has seen her PCP for but tonight it got worse and pt felt like she couldn't get her breath. Alert and oriented per norm.

## 2014-09-22 ENCOUNTER — Encounter (HOSPITAL_COMMUNITY): Payer: Self-pay | Admitting: Emergency Medicine

## 2014-09-22 ENCOUNTER — Emergency Department (HOSPITAL_COMMUNITY): Payer: Medicare Other

## 2014-09-22 ENCOUNTER — Inpatient Hospital Stay (HOSPITAL_COMMUNITY)
Admission: EM | Admit: 2014-09-22 | Discharge: 2014-09-25 | DRG: 177 | Disposition: A | Discharge status: Home / Self Care | Payer: Medicare Other | Attending: Internal Medicine | Admitting: Internal Medicine

## 2014-09-22 DIAGNOSIS — E860 Dehydration: Secondary | ICD-10-CM | POA: Diagnosis present

## 2014-09-22 DIAGNOSIS — I2510 Atherosclerotic heart disease of native coronary artery without angina pectoris: Secondary | ICD-10-CM | POA: Diagnosis present

## 2014-09-22 DIAGNOSIS — D649 Anemia, unspecified: Secondary | ICD-10-CM | POA: Diagnosis present

## 2014-09-22 DIAGNOSIS — R112 Nausea with vomiting, unspecified: Secondary | ICD-10-CM | POA: Diagnosis present

## 2014-09-22 DIAGNOSIS — R05 Cough: Secondary | ICD-10-CM | POA: Diagnosis not present

## 2014-09-22 DIAGNOSIS — N183 Chronic kidney disease, stage 3 unspecified: Secondary | ICD-10-CM | POA: Diagnosis present

## 2014-09-22 DIAGNOSIS — R059 Cough, unspecified: Secondary | ICD-10-CM

## 2014-09-22 DIAGNOSIS — E870 Hyperosmolality and hypernatremia: Secondary | ICD-10-CM

## 2014-09-22 DIAGNOSIS — R414 Neurologic neglect syndrome: Secondary | ICD-10-CM | POA: Diagnosis present

## 2014-09-22 DIAGNOSIS — R634 Abnormal weight loss: Secondary | ICD-10-CM | POA: Diagnosis present

## 2014-09-22 DIAGNOSIS — I129 Hypertensive chronic kidney disease with stage 1 through stage 4 chronic kidney disease, or unspecified chronic kidney disease: Secondary | ICD-10-CM | POA: Diagnosis present

## 2014-09-22 DIAGNOSIS — I482 Chronic atrial fibrillation, unspecified: Secondary | ICD-10-CM | POA: Diagnosis present

## 2014-09-22 DIAGNOSIS — I5032 Chronic diastolic (congestive) heart failure: Secondary | ICD-10-CM | POA: Diagnosis present

## 2014-09-22 DIAGNOSIS — M7989 Other specified soft tissue disorders: Secondary | ICD-10-CM | POA: Diagnosis present

## 2014-09-22 DIAGNOSIS — I4581 Long QT syndrome: Secondary | ICD-10-CM | POA: Diagnosis present

## 2014-09-22 DIAGNOSIS — Z79899 Other long term (current) drug therapy: Secondary | ICD-10-CM | POA: Diagnosis not present

## 2014-09-22 DIAGNOSIS — K449 Diaphragmatic hernia without obstruction or gangrene: Secondary | ICD-10-CM | POA: Diagnosis present

## 2014-09-22 DIAGNOSIS — Z7901 Long term (current) use of anticoagulants: Secondary | ICD-10-CM

## 2014-09-22 DIAGNOSIS — I6931 Cognitive deficits following cerebral infarction: Secondary | ICD-10-CM

## 2014-09-22 DIAGNOSIS — J690 Pneumonitis due to inhalation of food and vomit: Secondary | ICD-10-CM | POA: Diagnosis present

## 2014-09-22 DIAGNOSIS — J189 Pneumonia, unspecified organism: Secondary | ICD-10-CM

## 2014-09-22 DIAGNOSIS — R32 Unspecified urinary incontinence: Secondary | ICD-10-CM | POA: Diagnosis present

## 2014-09-22 DIAGNOSIS — I4891 Unspecified atrial fibrillation: Secondary | ICD-10-CM | POA: Diagnosis not present

## 2014-09-22 DIAGNOSIS — F0390 Unspecified dementia without behavioral disturbance: Secondary | ICD-10-CM | POA: Diagnosis present

## 2014-09-22 DIAGNOSIS — R600 Localized edema: Secondary | ICD-10-CM | POA: Diagnosis present

## 2014-09-22 DIAGNOSIS — Z8673 Personal history of transient ischemic attack (TIA), and cerebral infarction without residual deficits: Secondary | ICD-10-CM | POA: Diagnosis not present

## 2014-09-22 DIAGNOSIS — A419 Sepsis, unspecified organism: Secondary | ICD-10-CM

## 2014-09-22 DIAGNOSIS — R509 Fever, unspecified: Secondary | ICD-10-CM

## 2014-09-22 DIAGNOSIS — Z803 Family history of malignant neoplasm of breast: Secondary | ICD-10-CM

## 2014-09-22 HISTORY — DX: Diaphragmatic hernia without obstruction or gangrene: K44.9

## 2014-09-22 LAB — COMPREHENSIVE METABOLIC PANEL
ALT: 17 U/L (ref 14–54)
AST: 21 U/L (ref 15–41)
Albumin: 3.9 g/dL (ref 3.5–5.0)
Alkaline Phosphatase: 80 U/L (ref 38–126)
Anion gap: 10 (ref 5–15)
BUN: 27 mg/dL — ABNORMAL HIGH (ref 6–20)
CHLORIDE: 105 mmol/L (ref 101–111)
CO2: 31 mmol/L (ref 22–32)
CREATININE: 1.31 mg/dL — AB (ref 0.44–1.00)
Calcium: 9 mg/dL (ref 8.9–10.3)
GFR calc Af Amer: 41 mL/min — ABNORMAL LOW (ref >60)
GFR, EST NON AFRICAN AMERICAN: 36 mL/min — AB (ref >60)
Glucose, Bld: 112 mg/dL — ABNORMAL HIGH (ref 65–99)
POTASSIUM: 4.3 mmol/L (ref 3.5–5.1)
Sodium: 146 mmol/L — ABNORMAL HIGH (ref 135–145)
Total Bilirubin: 0.3 mg/dL (ref 0.3–1.2)
Total Protein: 6.7 g/dL (ref 6.5–8.1)

## 2014-09-22 LAB — CBC WITH DIFFERENTIAL/PLATELET
Basophils Absolute: 0.1 10*3/uL (ref 0.0–0.1)
Basophils Relative: 1 % (ref 0–1)
Eosinophils Absolute: 0.3 10*3/uL (ref 0.0–0.7)
Eosinophils Relative: 4 % (ref 0–5)
HCT: 37.1 % (ref 36.0–46.0)
HEMOGLOBIN: 11.6 g/dL — AB (ref 12.0–15.0)
LYMPHS ABS: 1.9 10*3/uL (ref 0.7–4.0)
Lymphocytes Relative: 21 % (ref 12–46)
MCH: 30.4 pg (ref 26.0–34.0)
MCHC: 31.3 g/dL (ref 30.0–36.0)
MCV: 97.4 fL (ref 78.0–100.0)
MONOS PCT: 9 % (ref 3–12)
Monocytes Absolute: 0.8 10*3/uL (ref 0.1–1.0)
Neutro Abs: 6.1 10*3/uL (ref 1.7–7.7)
Neutrophils Relative %: 67 % (ref 43–77)
Platelets: 222 10*3/uL (ref 150–400)
RBC: 3.81 MIL/uL — AB (ref 3.87–5.11)
RDW: 14.1 % (ref 11.5–15.5)
WBC: 9.1 10*3/uL (ref 4.0–10.5)

## 2014-09-22 LAB — I-STAT TROPONIN, ED: TROPONIN I, POC: 0 ng/mL (ref 0.00–0.08)

## 2014-09-22 LAB — I-STAT CG4 LACTIC ACID, ED: LACTIC ACID, VENOUS: 1.3 mmol/L (ref 0.5–2.0)

## 2014-09-22 LAB — LIPASE, BLOOD: Lipase: 16 U/L — ABNORMAL LOW (ref 22–51)

## 2014-09-22 LAB — BRAIN NATRIURETIC PEPTIDE: B Natriuretic Peptide: 160.5 pg/mL — ABNORMAL HIGH (ref 0.0–100.0)

## 2014-09-22 MED ORDER — DILTIAZEM HCL ER 180 MG PO CP24
180.0000 mg | ORAL_CAPSULE | Freq: Every day | ORAL | Status: DC
  Administered 2014-09-23 – 2014-09-25 (×3): 180 mg via ORAL
  Filled 2014-09-22 (×3): qty 1

## 2014-09-22 MED ORDER — DEXTROSE 5 % IV SOLN
500.0000 mg | Freq: Once | INTRAVENOUS | Status: AC
  Administered 2014-09-22: 500 mg via INTRAVENOUS
  Filled 2014-09-22: qty 500

## 2014-09-22 MED ORDER — POLYETHYLENE GLYCOL 3350 17 G PO PACK
17.0000 g | PACK | Freq: Two times a day (BID) | ORAL | Status: DC
  Administered 2014-09-23: 17 g via ORAL
  Filled 2014-09-22 (×2): qty 1

## 2014-09-22 MED ORDER — FAMOTIDINE 20 MG PO TABS
20.0000 mg | ORAL_TABLET | Freq: Two times a day (BID) | ORAL | Status: DC
  Administered 2014-09-22 – 2014-09-25 (×6): 20 mg via ORAL
  Filled 2014-09-22 (×7): qty 1

## 2014-09-22 MED ORDER — SODIUM CHLORIDE 0.9 % IV SOLN
3.0000 g | Freq: Three times a day (TID) | INTRAVENOUS | Status: DC
  Filled 2014-09-22: qty 3

## 2014-09-22 MED ORDER — HYDRALAZINE HCL 50 MG PO TABS
50.0000 mg | ORAL_TABLET | Freq: Two times a day (BID) | ORAL | Status: DC
  Administered 2014-09-22 – 2014-09-25 (×6): 50 mg via ORAL
  Filled 2014-09-22 (×7): qty 1

## 2014-09-22 MED ORDER — ENSURE ENLIVE PO LIQD
237.0000 mL | Freq: Two times a day (BID) | ORAL | Status: DC
  Administered 2014-09-23 – 2014-09-25 (×4): 237 mL via ORAL

## 2014-09-22 MED ORDER — SIMVASTATIN 10 MG PO TABS
10.0000 mg | ORAL_TABLET | Freq: Every day | ORAL | Status: DC
Start: 2014-09-22 — End: 2014-09-25
  Administered 2014-09-22 – 2014-09-25 (×4): 10 mg via ORAL
  Filled 2014-09-22 (×4): qty 1

## 2014-09-22 MED ORDER — FUROSEMIDE 10 MG/ML IJ SOLN
40.0000 mg | Freq: Every day | INTRAMUSCULAR | Status: DC
  Administered 2014-09-23: 40 mg via INTRAVENOUS
  Filled 2014-09-22 (×3): qty 4

## 2014-09-22 MED ORDER — GLUCAGON HCL RDNA (DIAGNOSTIC) 1 MG IJ SOLR
0.3000 mg | Freq: Once | INTRAMUSCULAR | Status: AC
  Administered 2014-09-22: 0.3 mg via INTRAVENOUS
  Filled 2014-09-22: qty 1

## 2014-09-22 MED ORDER — DEXTROSE 5 % IV SOLN
INTRAVENOUS | Status: DC
  Administered 2014-09-22: 20:00:00 via INTRAVENOUS

## 2014-09-22 MED ORDER — DOCUSATE SODIUM 100 MG PO CAPS
200.0000 mg | ORAL_CAPSULE | Freq: Every day | ORAL | Status: DC
  Administered 2014-09-23 – 2014-09-25 (×3): 200 mg via ORAL
  Filled 2014-09-22 (×3): qty 2

## 2014-09-22 MED ORDER — AZITHROMYCIN 500 MG IV SOLR
500.0000 mg | Freq: Once | INTRAVENOUS | Status: DC
  Filled 2014-09-22: qty 500

## 2014-09-22 MED ORDER — CEFTRIAXONE SODIUM IN DEXTROSE 20 MG/ML IV SOLN
1.0000 g | Freq: Once | INTRAVENOUS | Status: DC
  Filled 2014-09-22: qty 50

## 2014-09-22 MED ORDER — ACETAMINOPHEN 325 MG PO TABS
650.0000 mg | ORAL_TABLET | Freq: Four times a day (QID) | ORAL | Status: DC | PRN
  Administered 2014-09-23: 650 mg via ORAL
  Filled 2014-09-22: qty 2

## 2014-09-22 MED ORDER — ADULT MULTIVITAMIN W/MINERALS CH
1.0000 | ORAL_TABLET | Freq: Every day | ORAL | Status: DC
  Administered 2014-09-23 – 2014-09-25 (×3): 1 via ORAL
  Filled 2014-09-22 (×3): qty 1

## 2014-09-22 MED ORDER — MEMANTINE HCL ER 28 MG PO CP24
28.0000 mg | ORAL_CAPSULE | Freq: Every day | ORAL | Status: DC
  Administered 2014-09-23 – 2014-09-25 (×3): 28 mg via ORAL
  Filled 2014-09-22 (×3): qty 1

## 2014-09-22 MED ORDER — GUAIFENESIN-DM 100-10 MG/5ML PO SYRP: 5.0000 mL | ORAL_SOLUTION | ORAL | Status: DC | PRN

## 2014-09-22 MED ORDER — ONDANSETRON HCL 4 MG/2ML IJ SOLN
4.0000 mg | Freq: Once | INTRAMUSCULAR | Status: AC
  Administered 2014-09-22: 4 mg via INTRAVENOUS
  Filled 2014-09-22: qty 2

## 2014-09-22 MED ORDER — METOPROLOL TARTRATE 50 MG PO TABS
50.0000 mg | ORAL_TABLET | Freq: Two times a day (BID) | ORAL | Status: DC
Start: 2014-09-22 — End: 2014-09-25
  Administered 2014-09-22 – 2014-09-25 (×6): 50 mg via ORAL
  Filled 2014-09-22 (×7): qty 1

## 2014-09-22 MED ORDER — DABIGATRAN ETEXILATE MESYLATE 75 MG PO CAPS
75.0000 mg | ORAL_CAPSULE | Freq: Two times a day (BID) | ORAL | Status: DC
  Administered 2014-09-22 – 2014-09-25 (×6): 75 mg via ORAL
  Filled 2014-09-22 (×7): qty 1

## 2014-09-22 MED ORDER — SODIUM CHLORIDE 0.9 % IV SOLN
1.5000 g | Freq: Two times a day (BID) | INTRAVENOUS | Status: DC
  Administered 2014-09-22 – 2014-09-23 (×2): 1.5 g via INTRAVENOUS
  Filled 2014-09-22 (×3): qty 1.5

## 2014-09-22 MED ORDER — LORAZEPAM 2 MG/ML IJ SOLN: 0.5000 mg | INTRAMUSCULAR | Status: DC | PRN

## 2014-09-22 MED ORDER — DEXTROSE 5 % IV SOLN
1.0000 g | Freq: Once | INTRAVENOUS | Status: DC
  Filled 2014-09-22: qty 10

## 2014-09-22 MED ORDER — PROMETHAZINE HCL 25 MG/ML IJ SOLN: 12.5000 mg | Freq: Four times a day (QID) | INTRAMUSCULAR | Status: DC | PRN

## 2014-09-22 MED ORDER — DARIFENACIN HYDROBROMIDE ER 15 MG PO TB24
15.0000 mg | ORAL_TABLET | Freq: Every day | ORAL | Status: DC
  Administered 2014-09-23 – 2014-09-25 (×3): 15 mg via ORAL
  Filled 2014-09-22 (×3): qty 1

## 2014-09-22 MED ORDER — AZITHROMYCIN 500 MG PO TABS
500.0000 mg | ORAL_TABLET | ORAL | Status: DC
  Filled 2014-09-22: qty 1

## 2014-09-22 MED ORDER — QUETIAPINE 12.5 MG HALF TABLET
12.5000 mg | ORAL_TABLET | Freq: Every day | ORAL | Status: DC
  Administered 2014-09-22 – 2014-09-24 (×3): 12.5 mg via ORAL
  Filled 2014-09-22 (×4): qty 1

## 2014-09-22 MED ORDER — ALBUTEROL SULFATE (2.5 MG/3ML) 0.083% IN NEBU: 2.5000 mg | INHALATION_SOLUTION | RESPIRATORY_TRACT | Status: DC | PRN

## 2014-09-22 MED ORDER — PANTOPRAZOLE SODIUM 40 MG PO TBEC
40.0000 mg | DELAYED_RELEASE_TABLET | Freq: Every day | ORAL | Status: DC
  Administered 2014-09-23 – 2014-09-25 (×3): 40 mg via ORAL
  Filled 2014-09-22 (×3): qty 1

## 2014-09-22 NOTE — ED Notes (Signed)
awaiting blood cx to be drawn prior to abx

## 2014-09-22 NOTE — ED Notes (Signed)
Call placed to give report, RN stated that she was unaware of pts arrival. She request to speak with CN prior to me giving report. Will f/u

## 2014-09-22 NOTE — ED Notes (Signed)
Unable to achieve IV access will have another RN attempt

## 2014-09-22 NOTE — H&P (Addendum)
[facility] History and Physical  Carolyn PaulaMarleen A Sanders UJW:[id number] DOB: 01/30/1926 DOA: 09/22/2014  Referring physician:  Chaney Mallingavid Yao PCP:  Zoila ShutterWOODYEAR,WYNNE E, MD   Chief Complaint:  Vomiting  HPI:  The patient is a 81 y.o. year-old female with history of stroke with left hemi-neglect, coronary artery disease, atrial fibrillation on pradaxa, hypertension, possible chronic diastolic heart failure (on lasix), moderate to severe dementia with behavioral disturbance, chronic kidney disease stage III, hiatal hernia, urinary incontinence who presents with nausea, vomiting.  The patient was last at their baseline health about a month ago. She lives at home with her daughter who is her primary caretaker and ambulates with 1 person assist and without assist device. At baseline she is oriented to person only and does not typically recognize her daughter. She was diagnosed with pneumonia and treated with antibiotics. Despite treatment with antibiotics she has had a persistent cough that is dry, nonproductive, worse at night. She occasionally coughs when eating and drinking but her daughter has not noticed any association of increased cough with meals.  Her cough has been approximately stable. She has not had any recent fevers, chills. She was otherwise well when this morning she developed sudden onset nausea and vomiting. Her initial emesis was light green, but subsequent emesis was but she did eat and drink. Her last bowel movement was approximately 2 days ago and she alternates between constipation and diarrhea based on how much MiraLAX she receives. She has baseline urinary incontinence and there has not been any change to her urine output or odor. Her daughter states that her legs have been more swollen than usual. She has also had a 7 pound weight loss in the last 3 months.  In the emergency department, her oxygen saturations were in the low 90s on room air, blood pressure mildly elevated, heart rate in the  70s to 80s. Her labs were notable for sodium of 146. She had stable creatinine at 1.3. Mild chronic anemia. Her chest x-ray demonstrated bilateral chronic interstitial thickening with an element of possible mild interstitial edema or infection. She also had a large hiatal hernia. She was given ceftriaxone and azithromycin. She is also given a dose of glucagon to possibly help with her emesis in case she had a food impaction. She is being admitted for possible aspiration pneumonia.  Review of Systems:  Limited by patient's dementia.  Past Medical History  Diagnosis Date  . Stroke     residual left hemineglect  . Coronary artery disease   . Atrial fibrillation   . Hypertension   . Dementia   . Hiatal hernia    Past Surgical History  Procedure Laterality Date  . Carotid endarterectomy     Social History:  reports that she has never smoked. She has never used smokeless tobacco. She reports that she does not drink alcohol or use illicit drugs.  No Known Allergies  Family History  Problem Relation Age of Onset  . Breast cancer Mother      Prior to Admission medications   Medication Sig Start Date End Date Taking? Authorizing Provider  acetaminophen (TYLENOL) 325 MG tablet Take 2 tablets (650 mg total) by mouth every 6 (six) hours as needed for mild pain (or Fever >/= 101). 05/11/14  Yes Jeralyn BennettEzequiel Zamora, MD  dabigatran (PRADAXA) 75 MG CAPS Take 75 mg by mouth every 12 (twelve) hours.     Yes Historical Provider, MD  diltiazem (DILACOR XR) 180 MG 24 hr capsule Take 180 mg by mouth  daily.   Yes Historical Provider, MD  docusate sodium (COLACE) 100 MG capsule Take 200 mg by mouth daily.    Yes Historical Provider, MD  furosemide (LASIX) 20 MG tablet Take 20 mg by mouth every other day.   Yes Historical Provider, MD  hydrALAZINE (APRESOLINE) 50 MG tablet Take 50 mg by mouth 2 (two) times daily.    Yes Historical Provider, MD  memantine (NAMENDA XR) 28 MG CP24 24 hr capsule Take 28 mg by mouth  daily.   Yes Historical Provider, MD  metoprolol (LOPRESSOR) 50 MG tablet Take 50 mg by mouth 2 (two) times daily.   Yes Historical Provider, MD  Multiple Vitamin (MULTIVITAMIN WITH MINERALS) TABS tablet Take 1 tablet by mouth daily.   Yes Historical Provider, MD  pantoprazole (PROTONIX) 40 MG tablet Take 40 mg by mouth daily.   Yes Historical Provider, MD  polyethylene glycol (MIRALAX / GLYCOLAX) packet Take 17 g by mouth 2 (two) times daily. Patient taking differently: Take 17 g by mouth 3 (three) times daily as needed for mild constipation.  04/18/14  Yes Calvert Cantor, MD  simvastatin (ZOCOR) 10 MG tablet Take 10 mg by mouth daily.   Yes Historical Provider, MD  solifenacin (VESICARE) 10 MG tablet Take 10 mg by mouth daily.   Yes Historical Provider, MD  bisacodyl (DULCOLAX) 10 MG suppository Place 1 suppository (10 mg total) rectally daily as needed for moderate constipation. Patient not taking: Reported on 05/08/2014 04/18/14   Calvert Cantor, MD  cefUROXime (CEFTIN) 500 MG tablet Take 1 tablet (500 mg total) by mouth 2 (two) times daily with a meal. Patient not taking: Reported on 09/03/2014 05/11/14   Jeralyn Bennett, MD  senna-docusate (SENOKOT-S) 8.6-50 MG per tablet Take 1 tablet by mouth at bedtime. Patient not taking: Reported on 05/08/2014 04/18/14   Calvert Cantor, MD   Physical Exam: Filed Vitals:   09/22/14 1523 09/22/14 1746  BP: 134/62 167/85  Pulse: 74 83  Temp: 98.3 F (36.8 C)   TempSrc: Oral   Resp: 18 16  SpO2: 94% 92%     General:  Adult female, curled in the fetal position on the stretcher, but able to sit up with one assist during exam  Eyes:  PERRL, anicteric, non-injected.  ENT:  Nares clear.  OP clear, non-erythematous without plaques or exudates.  MMM.  Neck:  Supple without TM or JVD.    Lymph:  No cervical, supraclavicular, or submandibular LAD.  Cardiovascular:  IRRR, normal S1, S2, without m/r/g.  2+ pulses, warm extremities  Respiratory:  Slight gurgle in  the back of the throat, rales at the bilateral bases, no wheezes, without increased WOB.  Abdomen:  NABS.  Soft, mildly distended, nontender  Skin:  No rashes or focal lesions.  Musculoskeletal:  Normal bulk and tone.  1+ pitting bilateral LE edema.  Psychiatric:  Alert and oriented to person only. Repeatedly asked about going home.   Neurologic:  CN 3-12 intact.  5/5 strength.  Sensation intact.  Labs on Admission:  Basic Metabolic Panel:  Recent Labs Lab 09/22/14 1555  NA 146*  K 4.3  CL 105  CO2 31  GLUCOSE 112*  BUN 27*  CREATININE 1.31*  CALCIUM 9.0   Liver Function Tests:  Recent Labs Lab 09/22/14 1555  AST 21  ALT 17  ALKPHOS 80  BILITOT 0.3  PROT 6.7  ALBUMIN 3.9   No results for input(s): LIPASE, AMYLASE in the last 168 hours. No results for input(s): AMMONIA  in the last 168 hours. CBC:  Recent Labs Lab 09/22/14 1555  WBC 9.1  NEUTROABS 6.1  HGB 11.6*  HCT 37.1  MCV 97.4  PLT 222   Cardiac Enzymes: No results for input(s): CKTOTAL, CKMB, CKMBINDEX, TROPONINI in the last 168 hours.  BNP (last 3 results)  Recent Labs  05/08/14 2253 09/03/14 0403  BNP 457.4* 280.6*    ProBNP (last 3 results)  Recent Labs  11/18/13 2157  PROBNP 1073.0*    CBG: No results for input(s): GLUCAP in the last 168 hours.  Radiological Exams on Admission: Dg Chest 2 View  09/22/2014   CLINICAL DATA:  Confused, cough  EXAM: CHEST  2 VIEW  COMPARISON:  09/03/2014  FINDINGS: The patient is rotated to the left. There is bilateral diffuse interstitial thickening likely chronic. There is no pleural effusion, focal consolidation or pneumothorax. The heart and mediastinal contours are unremarkable. There is thoracic aortic atherosclerosis. There is a large hiatal hernia.  There are surgical clips in the right axilla.  The osseous structures are unremarkable.  IMPRESSION: 1. Bilateral chronic interstitial thickening. An element of superimposed mild interstitial edema  or infection cannot be excluded. 2. Large hiatal hernia.   Electronically Signed   By: Elige Ko   On: 09/22/2014 16:14    EKG: Independently reviewed. Atrial fibrillation, no ST segment elevations or depressions  Assessment/Plan Active Problems:   CAP (community acquired pneumonia)   Chronic renal disease, stage 3, moderately decreased glomerular filtration rate (GFR) between 30-59 mL/min/1.73 square meter   A-fib   Dementia   Chronic atrial fibrillation   Lower extremity edema   Hypernatremia   Cough  ---  Possible aspiration vs. Community acquired pneumonia -  Unasyn + azithromycin -  Blood cultures -  Legionella/Strep pneumo -  Check procalcitonin and if negative and cultures negative, stop abx early  Nausea, vomiting  -  Antiemetics -  LFTs wnl.  Check UA and lipase  Cough, differential diagnosis includes postinfectious reactive airways, acute on chronic diastolic heart failure, seasonal allergies, aspiration from progressive dementia -  Start Lasix 40 mg IV once daily -  Swallow evaluation -  Start dysphagia 1 diet with thin liquids for now -  Trial of albuterol  Lower extremity swelling, concerning for diastolic heart failure -  Check echocardiogram -  Daily weights, strict ins and outs -  Diuresis as above -  TED hose  CKD stage 3, stable at baseline -  Minimize nephrotoxins and renally dose medications  CAD, chest pain free, ECG stable.  Continue pradaxa, BB, statin  HTN, blood pressure mildly elevated -  Continue diltiazem and metoprolol and hydralazine  A-fib, rate controlled and on anticoagulation -  Continue pradaxa and BB and dilt  Prolonged QTc -  Minimize medications that prolong QTc (previously tolerated seroquel)  Dementia and history of CVA with residual left hemineglect -  At risk for delirium -  seroquel at nighttime helps her rest per family member -  Continue namenda  Gait instability and fall -  Falls precautions -   PT/OT  Hypernatremia, likely secondary to decreased free water intake -  Start D5 -  Repeat sodium in a.m.  Weight loss - nutrition consultation -  Ensure   Diet:  Dysphagia 1 Access:  PIV IVF:  Yes Proph:  Pradaxa  Code Status: full code, recommended DNR due to progressive severe dementia.  Please fill out MOST form prior to discharge. Family Communication: patient and her daughter Disposition Plan: Admit  to med-surg  Time spent: 60 min Renae Fickle [facility] Pager [phone]  If 7PM-7AM, please contact night-coverage [email or web address] Password [facility] 09/22/2014, 6:49 PM

## 2014-09-22 NOTE — ED Notes (Addendum)
Per caretaker pt complaint of emesis onset today; two episodes of vomiting; no pain; feels like something is stuck in through but denies SOB; hx of dementia.

## 2014-09-22 NOTE — Progress Notes (Signed)
ANTIBIOTIC CONSULT NOTE - INITIAL  Pharmacy Consult for Unasyn  Indication: Possible Aspiration Pneumonia  No Known Allergies  Patient Measurements: Weight: 150 lb 2.1 oz (68.1 kg)  Vital Signs: Temp: 97.6 F (36.4 C) (07/16 1948) Temp Source: Oral (07/16 1948) BP: 153/67 mmHg (07/16 1948) Pulse Rate: 66 (07/16 1948)  Labs:  Recent Labs  09/22/14 1555  WBC 9.1  HGB 11.6*  PLT 222  CREATININE 1.31*   Estimated Creatinine Clearance: 27.2 mL/min (by C-G formula based on Cr of 1.31). No results for input(s): VANCOTROUGH, VANCOPEAK, VANCORANDOM, GENTTROUGH, GENTPEAK, GENTRANDOM, TOBRATROUGH, TOBRAPEAK, TOBRARND, AMIKACINPEAK, AMIKACINTROU, AMIKACIN in the last 72 hours.   Medical History: Past Medical History  Diagnosis Date  . Stroke     residual left hemineglect  . Coronary artery disease   . Atrial fibrillation   . Hypertension   . Dementia   . Hiatal hernia     Assessment: CXR with possible PNA, starting Unasyn per pharmacy for aspiration PNA vs. CAP, MD adding azithromycin for atypical coverage. WBC WNL. Pt has CKD with CrCl 25-30 mL/min, other labs as above.   Plan:  -Unasyn 1.5g IV q12h -Azithromycin per MD -Trend WBC, temp, renal function  -F/U infectious work-up  Carolyn Sanders, Carolyn Sanders 09/22/2014,7:52 PM

## 2014-09-22 NOTE — ED Provider Notes (Signed)
CSN: [id number]     Arrival date & time 09/22/14  1517 History   First MD Initiated Contact with Patient 09/22/14 1534     Chief Complaint  Patient presents with  . Emesis     (Consider location/radiation/quality/duration/timing/severity/associated sxs/prior Treatment) The history is provided by the patient.  MARZETTA LANZA is a 81 y.o. female history of stroke, CAD, A. fib, dementia here presenting with vomiting. She ate some biscuits this morning and felt fine but when out to eat some lunch. She ate some fish and broccoli and then was vomiting. She felt like something was stuck in her chest. Denies cough or fever.   Level V caveat- dementia    Past Medical History  Diagnosis Date  . Stroke   . Coronary artery disease   . Atrial fibrillation   . Hypertension   . Dementia    History reviewed. No pertinent past surgical history. Family History  Problem Relation Age of Onset  . Breast cancer Mother    History  Substance Use Topics  . Smoking status: Never Smoker   . Smokeless tobacco: Never Used  . Alcohol Use: No   OB History    No data available     Review of Systems  Gastrointestinal: Positive for vomiting.  All other systems reviewed and are negative.     Allergies  Review of patient's allergies indicates no known allergies.  Home Medications   Prior to Admission medications   Medication Sig Start Date End Date Taking? Authorizing Provider  acetaminophen (TYLENOL) 325 MG tablet Take 2 tablets (650 mg total) by mouth every 6 (six) hours as needed for mild pain (or Fever >/= 101). 05/11/14  Yes Jeralyn Bennett, MD  dabigatran (PRADAXA) 75 MG CAPS Take 75 mg by mouth every 12 (twelve) hours.     Yes Historical Provider, MD  diltiazem (DILACOR XR) 180 MG 24 hr capsule Take 180 mg by mouth daily.   Yes Historical Provider, MD  docusate sodium (COLACE) 100 MG capsule Take 200 mg by mouth daily.    Yes Historical Provider, MD  furosemide (LASIX) 20 MG tablet Take  20 mg by mouth every other day.   Yes Historical Provider, MD  hydrALAZINE (APRESOLINE) 50 MG tablet Take 50 mg by mouth 2 (two) times daily.    Yes Historical Provider, MD  memantine (NAMENDA XR) 28 MG CP24 24 hr capsule Take 28 mg by mouth daily.   Yes Historical Provider, MD  metoprolol (LOPRESSOR) 50 MG tablet Take 50 mg by mouth 2 (two) times daily.   Yes Historical Provider, MD  Multiple Vitamin (MULTIVITAMIN WITH MINERALS) TABS tablet Take 1 tablet by mouth daily.   Yes Historical Provider, MD  pantoprazole (PROTONIX) 40 MG tablet Take 40 mg by mouth daily.   Yes Historical Provider, MD  polyethylene glycol (MIRALAX / GLYCOLAX) packet Take 17 g by mouth 2 (two) times daily. Patient taking differently: Take 17 g by mouth 3 (three) times daily as needed for mild constipation.  04/18/14  Yes Calvert Cantor, MD  simvastatin (ZOCOR) 10 MG tablet Take 10 mg by mouth daily.   Yes Historical Provider, MD  solifenacin (VESICARE) 10 MG tablet Take 10 mg by mouth daily.   Yes Historical Provider, MD  bisacodyl (DULCOLAX) 10 MG suppository Place 1 suppository (10 mg total) rectally daily as needed for moderate constipation. Patient not taking: Reported on 05/08/2014 04/18/14   Calvert Cantor, MD  cefUROXime (CEFTIN) 500 MG tablet Take 1 tablet (500 mg  total) by mouth 2 (two) times daily with a meal. Patient not taking: Reported on 09/03/2014 05/11/14   Jeralyn BennettEzequiel Zamora, MD  senna-docusate (SENOKOT-S) 8.6-50 MG per tablet Take 1 tablet by mouth at bedtime. Patient not taking: Reported on 05/08/2014 04/18/14   Calvert CantorSaima Rizwan, MD   BP 134/62 mmHg  Pulse 74  Temp(Src) 98.3 F (36.8 C) (Oral)  Resp 18  SpO2 94% Physical Exam  Constitutional: She is oriented to person, place, and time.  Chronically ill   HENT:  Head: Normocephalic.  Mouth/Throat: Oropharynx is clear and moist.  No obvious FS in OP  Eyes: Conjunctivae are normal. Pupils are equal, round, and reactive to light.  Neck: Normal range of motion. Neck  supple.  Cardiovascular: Normal rate, regular rhythm and normal heart sounds.   Pulmonary/Chest: Effort normal and breath sounds normal. No respiratory distress. She has no wheezes. She has no rales.  Abdominal: Soft. Bowel sounds are normal. She exhibits no distension. There is no tenderness. There is no rebound and no guarding.  Musculoskeletal: Normal range of motion. She exhibits no edema or tenderness.  Neurological: She is alert and oriented to person, place, and time. No cranial nerve deficit. Coordination normal.  Skin: Skin is warm and dry.  Psychiatric: She has a normal mood and affect. Her behavior is normal. Judgment and thought content normal.  Nursing note and vitals reviewed.   ED Course  Procedures (including critical care time) Labs Review Labs Reviewed  CBC WITH DIFFERENTIAL/PLATELET - Abnormal; Notable for the following:    RBC 3.81 (*)    Hemoglobin 11.6 (*)    All other components within normal limits  COMPREHENSIVE METABOLIC PANEL - Abnormal; Notable for the following:    Sodium 146 (*)    Glucose, Bld 112 (*)    BUN 27 (*)    Creatinine, Ser 1.31 (*)    GFR calc non Af Amer 36 (*)    GFR calc Af Amer 41 (*)    All other components within normal limits  CULTURE, BLOOD (ROUTINE X 2)  CULTURE, BLOOD (ROUTINE X 2)  I-STAT TROPOININ, ED  I-STAT CG4 LACTIC ACID, ED    Imaging Review Dg Chest 2 View  09/22/2014   CLINICAL DATA:  Confused, cough  EXAM: CHEST  2 VIEW  COMPARISON:  09/03/2014  FINDINGS: The patient is rotated to the left. There is bilateral diffuse interstitial thickening likely chronic. There is no pleural effusion, focal consolidation or pneumothorax. The heart and mediastinal contours are unremarkable. There is thoracic aortic atherosclerosis. There is a large hiatal hernia.  There are surgical clips in the right axilla.  The osseous structures are unremarkable.  IMPRESSION: 1. Bilateral chronic interstitial thickening. An element of superimposed  mild interstitial edema or infection cannot be excluded. 2. Large hiatal hernia.   Electronically Signed   By: Elige KoHetal  Patel   On: 09/22/2014 16:14     EKG Interpretation   Date/Time:  Saturday September 22 2014 16:25:27 EDT Ventricular Rate:  75 PR Interval:    QRS Duration: 94 QT Interval:  461 QTC Calculation: 515 R Axis:   89 Text Interpretation:  Atrial fibrillation Borderline right axis deviation  Prolonged QT interval No significant change since last tracing Confirmed  by YAO  MD, DAVID ([id number]) on 09/22/2014 4:31:19 PM      MDM   Final diagnoses:  None   Lora PaulaMarleen A Moraes is a 81 y.o. female here with vomiting. Consider food impaction vs reflux from hiatal hernia vs  aspiration pneumonia.  5:33 PM Labs showed Na 146, likely dehydrated. Felt better with meds and has no further vomiting. CXR showed possible pneumonia. Pulse ox slightly low 91-93%. Will give ceftriaxone/azithro. Will observe in the hospital for possible CAP vs aspiration pneumonia.      Richardean Canal, MD 09/22/14 [phone]

## 2014-09-23 ENCOUNTER — Inpatient Hospital Stay (HOSPITAL_COMMUNITY): Payer: Medicare Other

## 2014-09-23 DIAGNOSIS — A419 Sepsis, unspecified organism: Secondary | ICD-10-CM

## 2014-09-23 LAB — URINALYSIS, ROUTINE W REFLEX MICROSCOPIC
Bilirubin Urine: NEGATIVE
GLUCOSE, UA: NEGATIVE mg/dL
HGB URINE DIPSTICK: NEGATIVE
Ketones, ur: NEGATIVE mg/dL
Nitrite: NEGATIVE
PH: 7 (ref 5.0–8.0)
PROTEIN: NEGATIVE mg/dL
Specific Gravity, Urine: 1.013 (ref 1.005–1.030)
Urobilinogen, UA: 1 mg/dL (ref 0.0–1.0)

## 2014-09-23 LAB — URINE MICROSCOPIC-ADD ON

## 2014-09-23 LAB — CBC
HCT: 41.5 % (ref 36.0–46.0)
Hemoglobin: 12.8 g/dL (ref 12.0–15.0)
MCH: 30.3 pg (ref 26.0–34.0)
MCHC: 30.8 g/dL (ref 30.0–36.0)
MCV: 98.1 fL (ref 78.0–100.0)
PLATELETS: 168 10*3/uL (ref 150–400)
RBC: 4.23 MIL/uL (ref 3.87–5.11)
RDW: 13.9 % (ref 11.5–15.5)
WBC: 8 10*3/uL (ref 4.0–10.5)

## 2014-09-23 LAB — BASIC METABOLIC PANEL
Anion gap: 10 (ref 5–15)
BUN: 24 mg/dL — ABNORMAL HIGH (ref 6–20)
CALCIUM: 9 mg/dL (ref 8.9–10.3)
CO2: 28 mmol/L (ref 22–32)
Chloride: 104 mmol/L (ref 101–111)
Creatinine, Ser: 1.35 mg/dL — ABNORMAL HIGH (ref 0.44–1.00)
GFR calc non Af Amer: 34 mL/min — ABNORMAL LOW (ref >60)
GFR, EST AFRICAN AMERICAN: 40 mL/min — AB (ref >60)
GLUCOSE: 100 mg/dL — AB (ref 65–99)
POTASSIUM: 3.9 mmol/L (ref 3.5–5.1)
SODIUM: 142 mmol/L (ref 135–145)

## 2014-09-23 LAB — GLUCOSE, CAPILLARY: GLUCOSE-CAPILLARY: 118 mg/dL — AB (ref 65–99)

## 2014-09-23 LAB — STREP PNEUMONIAE URINARY ANTIGEN: Strep Pneumo Urinary Antigen: NEGATIVE

## 2014-09-23 LAB — HIV ANTIBODY (ROUTINE TESTING W REFLEX): HIV SCREEN 4TH GENERATION: NONREACTIVE

## 2014-09-23 LAB — LACTIC ACID, PLASMA: Lactic Acid, Venous: 1.2 mmol/L (ref 0.5–2.0)

## 2014-09-23 MED ORDER — SENNA 8.6 MG PO TABS
2.0000 | ORAL_TABLET | Freq: Every day | ORAL | Status: DC
  Administered 2014-09-23 – 2014-09-24 (×2): 17.2 mg via ORAL
  Filled 2014-09-23 (×3): qty 2

## 2014-09-23 MED ORDER — VANCOMYCIN HCL IN DEXTROSE 750-5 MG/150ML-% IV SOLN
750.0000 mg | Freq: Once | INTRAVENOUS | Status: AC
  Administered 2014-09-23: 750 mg via INTRAVENOUS
  Filled 2014-09-23: qty 150

## 2014-09-23 MED ORDER — VANCOMYCIN HCL IN DEXTROSE 750-5 MG/150ML-% IV SOLN
750.0000 mg | INTRAVENOUS | Status: DC
  Administered 2014-09-24 – 2014-09-25 (×2): 750 mg via INTRAVENOUS
  Filled 2014-09-23 (×2): qty 150

## 2014-09-23 MED ORDER — PIPERACILLIN-TAZOBACTAM 3.375 G IVPB
3.3750 g | Freq: Three times a day (TID) | INTRAVENOUS | Status: DC
  Administered 2014-09-23 – 2014-09-24 (×2): 3.375 g via INTRAVENOUS
  Filled 2014-09-23 (×2): qty 50

## 2014-09-23 MED ORDER — POLYETHYLENE GLYCOL 3350 17 G PO PACK
17.0000 g | PACK | Freq: Three times a day (TID) | ORAL | Status: DC
  Administered 2014-09-23 – 2014-09-25 (×5): 17 g via ORAL
  Filled 2014-09-23 (×6): qty 1

## 2014-09-23 NOTE — Progress Notes (Signed)
ANTIBIOTIC CONSULT NOTE   Pharmacy Consult for Unasyn to zosyn/vancomycin Indication: Possible Aspiration Pneumonia  No Known Allergies  Patient Measurements: Height: 5\' 1"  (154.9 cm) Weight: 148 lb 13 oz (67.5 kg) IBW/kg (Calculated) : 47.8  Vital Signs: Temp: 102 F (38.9 C) (07/17 1400) Temp Source: Oral (07/17 1400) BP: 149/86 mmHg (07/17 1400) Pulse Rate: 98 (07/17 1400)  Labs:  Recent Labs  09/22/14 1555 09/23/14 0811  WBC 9.1 8.0  HGB 11.6* 12.8  PLT 222 168  CREATININE 1.31* 1.35*   Estimated Creatinine Clearance: 26.3 mL/min (by C-G formula based on Cr of 1.35). No results for input(s): VANCOTROUGH, VANCOPEAK, VANCORANDOM, GENTTROUGH, GENTPEAK, GENTRANDOM, TOBRATROUGH, TOBRAPEAK, TOBRARND, AMIKACINPEAK, AMIKACINTROU, AMIKACIN in the last 72 hours.   Medical History: Past Medical History  Diagnosis Date  . Stroke     residual left hemineglect  . Coronary artery disease   . Atrial fibrillation   . Hypertension   . Dementia   . Hiatal hernia     Assessment: [age] YOMF with possible PNA, on 7/16 started Unasyn per pharmacy for aspiration PNA vs. CAP, MD adding azithromycin for atypical coverage. WBC WNL. Pt has CKD with CrCl 25-30 mL/min, other labs as above.  Antibiotics changed to zosyn and vancomycin 7/17 for possible sepsis (fever of 102).   7/16 >> unasyn >> 7/17 7/16 >> azith (MD) >> 7/17 7/17 >> zosyn >> 7/17 >> vancomycin >>  Renal: SCr inc,normalized CrCl = 6934ml/min  7/16 blood: NGTD Strep Ag:  Legionella Ag:  Plan:   vancomcyin 750mg  IV q24h (1st dose now, next dose in am as a staggered loading dose)  Follow renal function   Vancomycin trough as indicated  Zosyn 3.375gm IV q8h over 4h infusion  Dannielle HuhZeigler, Takenya Travaglini George 09/23/2014,4:02 PM

## 2014-09-23 NOTE — Progress Notes (Signed)
Utilization review completed.  

## 2014-09-23 NOTE — Progress Notes (Addendum)
[facility] PROGRESS NOTE  Carolyn Sanders WJX:[id number] DOB: 01/19/1926 DOA: 09/22/2014 PCP: Zoila ShutterWOODYEAR,WYNNE E, MD  Brief Summary  The patient is a 81 y.o. year-old female with history of stroke with left hemi-neglect, coronary artery disease, atrial fibrillation on pradaxa, hypertension, possible chronic diastolic heart failure (on lasix), moderate to severe dementia with behavioral disturbance, chronic kidney disease stage III, hiatal hernia, urinary incontinence who presented with nausea, vomiting, chronic cough and lower extremity swelling. She lives at home with her daughter who is her primary caretaker.  In the emergency department, her oxygen saturations were in the low 90s on room air, blood pressure mildly elevated, sodium of 146.  Her chest x-ray demonstrated bilateral chronic interstitial thickening with an element of possible mild interstitial edema or infection.  She was admitted for possible aspiration pneumonia.    Assessment/Plan  Sepsis (fever, tachycardia) due to Aspiration vs. Community acquired pneumonia with rigors and change in mental status - d/c Unasyn + azithromycin and start vancomycin and zosyn - Blood cultures 7/16 -  Blood cultures 7/17  - Legionella/Strep pneumo pending -  Sepsis protocol  Nausea, vomiting, none since admission - Antiemetics - LFTs wnl. UA neg and lipase neg -  Abdomen remains soft  Cough, differential diagnosis includes postinfectious reactive airways, acute on chronic diastolic heart failure, seasonal allergies, aspiration from progressive dementia - continue Lasix 40 mg IV once daily - Swallow evaluation - dysphagia 1 diet  - Trial of albuterol  Lower extremity swelling, concerning for diastolic heart failure - Check echocardiogram - Daily weights:  Trending down, swelling improving - Diuresis as above - TED hose -  Lower extremity duplex  CKD stage 3, stable at baseline - Minimize nephrotoxins and renally  dose medications  CAD, chest pain free, ECG stable. Continue pradaxa, BB, statin  HTN, blood pressure mildly elevated - Continue diltiazem and metoprolol and hydralazine  A-fib, rate controlled and on anticoagulation - Continue pradaxa and BB and dilt  Prolonged QTc - Minimize medications that prolong QTc (previously tolerated seroquel)  Dementia and history of CVA with residual left hemineglect - At risk for delirium - seroquel at nighttime helps her rest per family member - Continue namenda  Gait instability and fall - Falls precautions - PT/OT recommending HH services with 24h supervision  Hypernatremia, likely secondary to decreased free water intake, trending down but not down to 140 yet - continue D5W until sodium < or = 140 - Repeat sodium in a.m.  Weight loss  -  Nutrition consultation - Ensure   Diet: Dysphagia 1 Access: PIV IVF: Yes Proph: Pradaxa  Code Status:  Full code, recommended DNR due to progressive severe dementia. Filled out MOST form with family.  Aggressive measures except for feeding tube.  Continue to readdress.   Family Communication: patient and her daughter Disposition Plan:  Continue med-surg  Consultants:  None  Procedures:  CXR  Antibiotics:  Unasyn 7/16 > 7/17  Azithromycin 7/16 > 7/17  Vancomycin 7/17 >  Zosyn 7/17 >     HPI/Subjective:  Patient states she feels very well today.  Denies pain, SOB, cough, nausea, vomiting, diarrhea  Objective: Filed Vitals:   09/23/14 0447 09/23/14 0500 09/23/14 1342 09/23/14 1400  BP: 153/60   149/86  Pulse: 79   98  Temp: 97.5 F (36.4 C)   102 F (38.9 C)  TempSrc:    Oral  Resp: 16   18  Height:   5\' 1"  (1.549 m)   Weight:  67.5 kg (  148 lb 13 oz)    SpO2: 99%   98%    Intake/Output Summary (Last 24 hours) at 09/23/14 1547 Last data filed at 09/23/14 1230  Gross per 24 hour  Intake 1404.2 ml  Output    800 ml  Net  604.2 ml   Filed Weights   09/22/14  1948 09/23/14 0500  Weight: 68.1 kg (150 lb 2.1 oz) 67.5 kg (148 lb 13 oz)   Body mass index is 28.13 kg/(m^2).  Exam:   General:  Thin female, No acute distress this morning and initially looked great  HEENT:  NCAT, MMM  Cardiovascular:  IRRR, nl S1, S2 no mrg, 2+ pulses, warm extremities  Respiratory:  Bilateral rales, diminished, no rhonchi, no increased WOB  Abdomen:   NABS, soft, NT/ND  MSK:   Normal tone and bulk, 1+ LEE of the left extremity and trace edema of the right extremity  Neuro:  Grossly intact  Data Reviewed: Basic Metabolic Panel:  Recent Labs Lab 09/22/14 1555 09/23/14 0811  NA 146* 142  K 4.3 3.9  CL 105 104  CO2 31 28  GLUCOSE 112* 100*  BUN 27* 24*  CREATININE 1.31* 1.35*  CALCIUM 9.0 9.0   Liver Function Tests:  Recent Labs Lab 09/22/14 1555  AST 21  ALT 17  ALKPHOS 80  BILITOT 0.3  PROT 6.7  ALBUMIN 3.9    Recent Labs Lab 09/22/14 2034  LIPASE 16*   No results for input(s): AMMONIA in the last 168 hours. CBC:  Recent Labs Lab 09/22/14 1555 09/23/14 0811  WBC 9.1 8.0  NEUTROABS 6.1  --   HGB 11.6* 12.8  HCT 37.1 41.5  MCV 97.4 98.1  PLT 222 168    Recent Results (from the past 240 hour(s))  Blood culture (routine x 2)     Status: None (Preliminary result)   Collection Time: 09/22/14  4:20 PM  Result Value Ref Range Status   Specimen Description BLOOD RIGHT WRIST  Final   Special Requests BOTTLES DRAWN AEROBIC AND ANAEROBIC 5CC  Final   Culture   Final    NO GROWTH < 24 HOURS Performed at [facility]    Report Status PENDING  Incomplete  Blood culture (routine x 2)     Status: None (Preliminary result)   Collection Time: 09/22/14  6:48 PM  Result Value Ref Range Status   Specimen Description BLOOD LEFT HAND  Final   Special Requests   Final    BOTTLES DRAWN AEROBIC AND ANAEROBIC 3CC EA Performed at [facility]    Culture PENDING  Incomplete   Report Status PENDING  Incomplete      Studies: Dg Chest 2 View  09/22/2014   CLINICAL DATA:  Confused, cough  EXAM: CHEST  2 VIEW  COMPARISON:  09/03/2014  FINDINGS: The patient is rotated to the left. There is bilateral diffuse interstitial thickening likely chronic. There is no pleural effusion, focal consolidation or pneumothorax. The heart and mediastinal contours are unremarkable. There is thoracic aortic atherosclerosis. There is a large hiatal hernia.  There are surgical clips in the right axilla.  The osseous structures are unremarkable.  IMPRESSION: 1. Bilateral chronic interstitial thickening. An element of superimposed mild interstitial edema or infection cannot be excluded. 2. Large hiatal hernia.   Electronically Signed   By: Elige Ko   On: 09/22/2014 16:14    Scheduled Meds: . ampicillin-sulbactam (UNASYN) IV  1.5 g Intravenous Q12H  . azithromycin  500 mg  Oral Q24H  . dabigatran  75 mg Oral Q12H  . darifenacin  15 mg Oral Daily  . diltiazem  180 mg Oral Daily  . docusate sodium  200 mg Oral Daily  . famotidine  20 mg Oral BID  . feeding supplement (ENSURE ENLIVE)  237 mL Oral BID BM  . furosemide  40 mg Intravenous Daily  . hydrALAZINE  50 mg Oral BID  . memantine  28 mg Oral Daily  . metoprolol  50 mg Oral BID  . multivitamin with minerals  1 tablet Oral Daily  . pantoprazole  40 mg Oral Daily  . polyethylene glycol  17 g Oral TID  . QUEtiapine  12.5 mg Oral QHS  . senna  2 tablet Oral QHS  . simvastatin  10 mg Oral Daily   Continuous Infusions: . dextrose 50 mL/hr at 09/22/14 1953    Active Problems:   CAP (community acquired pneumonia)   Chronic renal disease, stage 3, moderately decreased glomerular filtration rate (GFR) between 30-59 mL/min/1.73 square meter   A-fib   Dementia   Chronic atrial fibrillation   Lower extremity edema   Hypernatremia   Cough   Sepsis    Time spent: 30 min    Kalea Perine, [facility]  [facility] Pager [phone]. If 7PM-7AM, please contact  night-coverage at [email or web address], password [facility] 09/23/2014, 3:47 PM  LOS: 1 day

## 2014-09-23 NOTE — Progress Notes (Signed)
Unable to obtain urine specimen via in and out cath. Will pass on to day shift to obtain at next void.

## 2014-09-23 NOTE — Progress Notes (Signed)
Patient arrived on the unit at approximately 1938 last night. She is alert to self only, impulsive and has unsteady gait. No complaints voiced at this time.

## 2014-09-23 NOTE — Evaluation (Signed)
Occupational Therapy Evaluation Patient Details Name: Carolyn Sanders MRN: [id number] DOB: 08/06/1925 Today's Date: 09/23/2014    History of Present Illness 81 yo female admitted with pna. Hx of dementia, afib, htn, cad, cva with L hemi neglect   Clinical Impression   Pt admitted with PNA. Pt currently with functional limitations due to the deficits listed below (see OT Problem List).  Pt will benefit from skilled OT to increase their safety and independence with ADL and functional mobility for ADL to facilitate discharge to venue listed below.      Follow Up Recommendations  Home health OT;Supervision - Intermittent    Equipment Recommendations  None recommended by OT    Recommendations for Other Services       Precautions / Restrictions Precautions Precautions: Fall Restrictions Weight Bearing Restrictions: No      Mobility Bed Mobility Overal bed mobility: Needs Assistance Bed Mobility: Supine to Sit     Supine to sit: Supervision     General bed mobility comments: for safety  Transfers Overall transfer level: Needs assistance Equipment used: Rolling walker (2 wheeled) Transfers: Sit to/from Stand Sit to Stand: Min assist         General transfer comment: assist to rise, stabilize.     Balance Overall balance assessment: Needs assistance         Standing balance support: Bilateral upper extremity supported;During functional activity Standing balance-Leahy Scale: Poor                              ADL Overall ADL's : Needs assistance/impaired     Grooming: Wash/dry face;Sitting   Upper Body Bathing: Minimal assitance;Sitting   Lower Body Bathing: Cueing for safety;Moderate assistance   Upper Body Dressing : Set up;Sitting   Lower Body Dressing: Moderate assistance;Sit to/from stand   Toilet Transfer: Minimal assistance;BSC   Toileting- Clothing Manipulation and Hygiene: Minimal assistance;Sit to/from stand                Vision     Perception     Praxis      Pertinent Vitals/Pain Pain Assessment: No/denies pain     Hand Dominance     Extremity/Trunk Assessment Upper Extremity Assessment Upper Extremity Assessment: Generalized weakness   Lower Extremity Assessment Lower Extremity Assessment: Generalized weakness   Cervical / Trunk Assessment Cervical / Trunk Assessment: Kyphotic   Communication Communication Communication: HOH   Cognition Arousal/Alertness: Awake/alert Behavior During Therapy: WFL for tasks assessed/performed Overall Cognitive Status: History of cognitive impairments - at baseline                     General Comments       Exercises       Shoulder Instructions      Home Living Family/patient expects to be discharged to:: Private residence Living Arrangements: Children                               Additional Comments: hx of dementia, no family present, per chart review lives with daughter and [facility] typically doesn't use, also uses oxygen at home (2L however not clear if at night/just supine/? per notes in chart)      Prior Functioning/Environment               OT Diagnosis: Generalized weakness   OT Problem List: Decreased strength  OT Treatment/Interventions: Self-care/ADL training;DME and/or AE instruction;Patient/family education    OT Goals(Current goals can be found in the care plan section) Acute Rehab OT Goals Patient Stated Goal: none stated OT Goal Formulation: With patient Time For Goal Achievement: 10/07/14 Potential to Achieve Goals: Good  OT Frequency: Min 2X/week   Barriers to D/C:            Co-evaluation              End of Session Nurse Communication: Mobility status  Activity Tolerance: Patient tolerated treatment well Patient left: in chair;with call bell/phone within reach;with chair alarm set   Time: 1610-9604 OT Time Calculation (min): 24 min Charges:  OT General  Charges $OT Visit: 1 Procedure OT Evaluation $Initial OT Evaluation Tier I: 1 Procedure OT Treatments $Self Care/Home Management : 8-22 mins G-Codes:    Einar Crow D 2014-10-05, 11:37 AM

## 2014-09-23 NOTE — Progress Notes (Signed)
Initial Nutrition Assessment  DOCUMENTATION CODES:   Not applicable  INTERVENTION:   Continue Ensure Enlive po BID, each supplement provides 350 kcal and 20 grams of protein Provide Magic cup BID with meals, each supplement provides 290 kcal and 9 grams of protein Encourage PO intake  NUTRITION DIAGNOSIS:   Inadequate oral intake related to dysphagia, lethargy/confusion as evidenced by other (see comment) (need for modified diet).  GOAL:   Patient will meet greater than or equal to 90% of their needs  MONITOR:   PO intake, Supplement acceptance, Labs, Weight trends, Skin, I & O's  REASON FOR ASSESSMENT:   Consult Assessment of nutrition requirement/status  ASSESSMENT:   81 y.o. year-old female with history of stroke with left hemi-neglect, coronary artery disease, atrial fibrillation on pradaxa, hypertension, possible chronic diastolic heart failure (on lasix), moderate to severe dementia with behavioral disturbance, chronic kidney disease stage III, hiatal hernia, urinary incontinence who presents with nausea, vomiting.   Pt asleep in room with daughter and family at bedside. Pt's daughter states that she ate well today (~100% of Dys 1 diet). Per daughter, pt has had a little weight loss, UBW is around 154 lb. Pt is drinking her supplements with no issues. RD to order pt Magic cups as well. No height in EPIC, per daughter pt is 5'1".  Nutrition focused physical exam shows no sign of depletion of muscle mass or body fat.  Labs Reviewed: Elevated BUN & Creatinine  Diet Order:  DIET - DYS 1 Room service appropriate?: Yes; Fluid consistency:: Thin  Skin:  Reviewed, no issues  Last BM:  PTA  Height:   Ht Readings from Last 1 Encounters:  09/23/14 5\' 1"  (1.549 m)    Weight:   Wt Readings from Last 1 Encounters:  09/23/14 148 lb 13 oz (67.5 kg)    Ideal Body Weight:  47.7 kg  Wt Readings from Last 10 Encounters:  09/23/14 148 lb 13 oz (67.5 kg)  06/06/14 149  lb 9.6 oz (67.858 kg)  05/16/14 149 lb 9.6 oz (67.858 kg)  05/15/14 149 lb 9.6 oz (67.858 kg)  05/09/14 150 lb 6.4 oz (68.221 kg)  04/15/14 152 lb 12.5 oz (69.3 kg)    BMI:  Body mass index is 28.13 kg/(m^2).  Estimated Nutritional Needs:   Kcal:  1500-1700  Protein:  80-90g  Fluid:  1.6L/day  EDUCATION NEEDS:   No education needs identified at this time  Tilda FrancoLindsey Trevonne Nyland, MS, RD, LDN Pager: [phone] After Hours Pager: [phone]

## 2014-09-23 NOTE — Discharge Instructions (Signed)

## 2014-09-23 NOTE — Evaluation (Signed)
Physical Therapy Evaluation Patient Details Name: NIDIA GROGAN MRN: [id number] DOB: 04/12/1925 Today's Date: 09/23/2014   History of Present Illness  81 yo female admitted with pna. Hx of dementia, afib, htn, cad, cva with L hemi neglect  Clinical Impression  On eval, pt required Min assist for mobility-able to ambulate ~70 feet with RW. O2 sats 93% on RA during session. No family present during session to provide PLOF/home environment info. Recommend HHPT and 24 hour supervision/assist if family agrees.     Follow Up Recommendations Home health PT;Supervision/Assistance - 24 hour    Equipment Recommendations  None recommended by PT    Recommendations for Other Services       Precautions / Restrictions Precautions Precautions: Fall Restrictions Weight Bearing Restrictions: No      Mobility  Bed Mobility Overal bed mobility: Needs Assistance Bed Mobility: Supine to Sit     Supine to sit: Supervision     General bed mobility comments: for safety  Transfers Overall transfer level: Needs assistance Equipment used: Rolling walker (2 wheeled) Transfers: Sit to/from Stand Sit to Stand: Min assist         General transfer comment: assist to rise, stabilize.   Ambulation/Gait Ambulation/Gait assistance: Min assist Ambulation Distance (Feet): 70 Feet Assistive device: Rolling walker (2 wheeled) Gait Pattern/deviations: Step-through pattern;Trunk flexed;Decreased stride length     General Gait Details: assist to stabilize pt and maneuver safely with RW. O2 sats 93% on RA  Stairs            Wheelchair Mobility    Modified Rankin (Stroke Patients Only)       Balance Overall balance assessment: Needs assistance         Standing balance support: Bilateral upper extremity supported;During functional activity Standing balance-Leahy Scale: Poor                               Pertinent Vitals/Pain Pain Assessment: No/denies pain     Home Living Family/patient expects to be discharged to:: Private residence Living Arrangements: Children               Additional Comments: hx of dementia, no family present, per chart review lives with daughter and has RW and [facility] however typically doesn't use, also uses oxygen at home (2L however not clear if at night/just supine/? per notes in chart)    Prior Function                 Hand Dominance        Extremity/Trunk Assessment   Upper Extremity Assessment: Generalized weakness           Lower Extremity Assessment: Generalized weakness      Cervical / Trunk Assessment: Kyphotic  Communication   Communication: HOH  Cognition Arousal/Alertness: Awake/alert Behavior During Therapy: WFL for tasks assessed/performed Overall Cognitive Status: History of cognitive impairments - at baseline                      General Comments      Exercises        Assessment/Plan    PT Assessment Patient needs continued PT services  PT Diagnosis Difficulty walking;Generalized weakness   PT Problem List Decreased strength;Decreased activity tolerance;Decreased balance;Decreased mobility;Decreased cognition;Decreased knowledge of use of DME  PT Treatment Interventions DME instruction;Gait training;Functional mobility training;Therapeutic activities;Patient/family education;Therapeutic exercise;Balance training   PT Goals (Current goals can be found in the Care  Plan section) Acute Rehab PT Goals Patient Stated Goal: none stated PT Goal Formulation: Patient unable to participate in goal setting Time For Goal Achievement: 10/07/14 Potential to Achieve Goals: Fair    Frequency Min 3X/week   Barriers to discharge        Co-evaluation               End of Session Equipment Utilized During Treatment: Gait belt Activity Tolerance: Patient tolerated treatment well Patient left: in bed;with call bell/phone within reach;with bed alarm set            Time: [id number] PT Time Calculation (min) (ACUTE ONLY): 22 min   Charges:   PT Evaluation $Initial PT Evaluation Tier I: 1 Procedure     PT G Codes:        Rebeca AlertJannie Shanekia Latella, MPT Pager: [phone]

## 2014-09-23 NOTE — Progress Notes (Signed)
Reevaluated patient this afternoon due to high fever.  Family reports she had a transient left facial droop that spontaneously resolved during her period of high fever along with  "drunken" look and mildly slurred speech.  She was shaking during the episode.   Patient continues to deny acute complaints and is looking better according to her family.  T = 100.90F currently, mildly tachycardic, blood pressure stable GEN:  Adult female, initially with eyes closed lying on left side HEENT:  Face while lying on left side appeared to have a mild left facial droop that immediately went away when she lay on her back.  MMM CV:  IRRR, HR in 90s, no mrg PULM:  Course bilateral rales ABD:  Hyperactive BS, soft, mildly distended, nontender MSK:  1+ LLE edema NEuro:  Face symmetric, strength 4/5 throughout with bilateral leg drift a little faster on the left than right.    A/p:  Sepsis probably due to her pneumonia.   -  Considered leaving her antibiotics the same, however, I am worried about how she was rigoring during her fever -  Start sepsis protocol and escalate antibiotics to vanc and zosyn -  Discussed holding off on further evaluation for stroke since she is already on statin, aspirin, pradaxa and would likely not change management at this point unless her symptoms returned or were more severe. -  Full code

## 2014-09-24 ENCOUNTER — Inpatient Hospital Stay (HOSPITAL_COMMUNITY): Payer: Medicare Other

## 2014-09-24 DIAGNOSIS — M7989 Other specified soft tissue disorders: Secondary | ICD-10-CM

## 2014-09-24 DIAGNOSIS — I4891 Unspecified atrial fibrillation: Secondary | ICD-10-CM

## 2014-09-24 DIAGNOSIS — A419 Sepsis, unspecified organism: Secondary | ICD-10-CM

## 2014-09-24 LAB — BASIC METABOLIC PANEL
Anion gap: 11 (ref 5–15)
BUN: 38 mg/dL — AB (ref 6–20)
CO2: 32 mmol/L (ref 22–32)
Calcium: 8.6 mg/dL — ABNORMAL LOW (ref 8.9–10.3)
Chloride: 96 mmol/L — ABNORMAL LOW (ref 101–111)
Creatinine, Ser: 1.95 mg/dL — ABNORMAL HIGH (ref 0.44–1.00)
GFR calc non Af Amer: 22 mL/min — ABNORMAL LOW (ref >60)
GFR, EST AFRICAN AMERICAN: 26 mL/min — AB (ref >60)
GLUCOSE: 118 mg/dL — AB (ref 65–99)
POTASSIUM: 3.7 mmol/L (ref 3.5–5.1)
SODIUM: 139 mmol/L (ref 135–145)

## 2014-09-24 LAB — CBC
HCT: 31.1 % — ABNORMAL LOW (ref 36.0–46.0)
Hemoglobin: 10.1 g/dL — ABNORMAL LOW (ref 12.0–15.0)
MCH: 30.8 pg (ref 26.0–34.0)
MCHC: 32.5 g/dL (ref 30.0–36.0)
MCV: 94.8 fL (ref 78.0–100.0)
Platelets: 199 10*3/uL (ref 150–400)
RBC: 3.28 MIL/uL — AB (ref 3.87–5.11)
RDW: 14.2 % (ref 11.5–15.5)
WBC: 9 10*3/uL (ref 4.0–10.5)

## 2014-09-24 MED ORDER — KCL IN DEXTROSE-NACL 10-5-0.45 MEQ/L-%-% IV SOLN
INTRAVENOUS | Status: DC
  Administered 2014-09-24 – 2014-09-25 (×2): via INTRAVENOUS
  Filled 2014-09-24 (×3): qty 1000

## 2014-09-24 MED ORDER — PIPERACILLIN-TAZOBACTAM IN DEX 2-0.25 GM/50ML IV SOLN
2.2500 g | Freq: Four times a day (QID) | INTRAVENOUS | Status: DC
  Administered 2014-09-24 – 2014-09-25 (×5): 2.25 g via INTRAVENOUS
  Filled 2014-09-24 (×6): qty 50

## 2014-09-24 NOTE — Progress Notes (Signed)
Occupational Therapy Treatment Patient Details Name: Carolyn Sanders MRN: [id number] DOB: 06/02/1925 Today's Date: 09/24/2014    History of present illness 81 yo female admitted with pna. Hx of dementia, afib, htn, cad, cva with L hemi neglect   OT comments  Pt will need SNF if family cant provide 24/7  Follow Up Recommendations  SNF;Supervision/Assistance - 24 hour;Home health OT;Other (comment) (depending on families ability to provide 24/7 A)    Equipment Recommendations  None recommended by OT       Precautions / Restrictions Precautions Precautions: Fall       Mobility Bed Mobility               General bed mobility comments: pt in chair  Transfers Overall transfer level: Needs assistance Equipment used: 1 person hand held assist Transfers: Sit to/from Stand Sit to Stand: Min assist         General transfer comment: assist to rise, stabilize.         ADL Overall ADL's : Needs assistance/impaired                         Toilet Transfer: Minimal assistance;BSC   Toileting- Clothing Manipulation and Hygiene: Minimal assistance;Sit to/from stand         General ADL Comments: Pt very sleepy this OT visit. No family present. Pt will need 24/7 A or SNF for safety                Cognition   Behavior During Therapy: [facility]FL for tasks assessed/performed Overall Cognitive Status: History of cognitive impairments - at baseline                               General Comments  pt very confused- will need 24/7 A                Progress Toward Goals  OT Goals(current goals can now be found in the care plan section)  Progress towards OT goals: OT to reassess next treatment     Plan Discharge plan needs to be updated       End of Session     Activity Tolerance Patient limited by fatigue   Patient Left in chair;with call bell/phone within reach   Nurse Communication           Alba CoryREDDING, Liviah Cake D 09/24/2014, 2:15  PM

## 2014-09-24 NOTE — Care Management Important Message (Signed)
Important Message  Patient Details  Name: Carolyn Sanders MRN: [id number] Date of Birth: 01/11/1926   Medicare Important Message Given:  Yes-second notification given    Haskell FlirtJamison, Riddick Nuon 09/24/2014, 11:47 AMImportant Message  Patient Details  Name: Carolyn Sanders MRN: [id number] Date of Birth: 01/05/1926   Medicare Important Message Given:  Yes-second notification given    Haskell FlirtJamison, Takuma Cifelli 09/24/2014, 11:47 AM

## 2014-09-24 NOTE — Evaluation (Signed)
Clinical/Bedside Swallow Evaluation Patient Details  Name: Carolyn Sanders MRN: [id number] Date of Birth: 08/01/1925  Today's Date: 09/24/2014 Time: SLP Start Time (ACUTE ONLY): 1422 SLP Stop Time (ACUTE ONLY): 1450 SLP Time Calculation (min) (ACUTE ONLY): 28 min  Past Medical History:  Past Medical History  Diagnosis Date  . Stroke     residual left hemineglect  . Coronary artery disease   . Atrial fibrillation   . Hypertension   . Dementia   . Hiatal hernia    Past Surgical History:  Past Surgical History  Procedure Laterality Date  . Carotid endarterectomy     HPI:  The patient is a 81 y.o. year-old female with history of stroke with left hemi-neglect, coronary artery disease, atrial fibrillation on pradaxa, hypertension, possible chronic diastolic heart failure (on lasix), moderate to severe dementia with behavioral disturbance, chronic kidney disease stage III, hiatal hernia, urinary incontinence who presented with nausea, vomiting, chronic cough and lower extremity swelling per MD note.  She lives at home with her daughter who is her primary caretaker per MD note.Chest x-ray demonstrated bilateral chronic interstitial thickening with an element of possible mild interstitial edema or infection. She was admitted for possible aspiration pneumonia. Pt has a LARGE HIATAL HERNIA per CXR.  Swallow evaluation ordered.     Assessment / Plan / Recommendation Clinical Impression  Pt presents with functional oropharyngeal swallow without s/s of aspiration across all consistencies provided.  Pt observed consuming 4 ounces juice, 4 ounces applesauce and two graham crackers.  Oral manipulation was timely with only mild residuals of solids that she sensed and cleared.  Laryngeal elevation palpated at bedside was adequate.  Given pt has a large hiatal hernia, if aspiration is concern - suspect this may be source.  Pt does admit to occasionally choking on foods/drinks but states is minimal.   Recommend consider advancing to regular/thin with precautions.  If concerns are present for possible esophageal deficits, esophageal evaluation may be beneficial.  SlP to sign off.     Aspiration Risk  Moderate    Diet Recommendation Age appropriate regular solids;Thin   Medication Administration: Whole meds with liquid Compensations: Slow rate;Small sips/bites    Other  Recommendations Oral Care Recommendations: Oral care BID   Follow Up Recommendations       Frequency and Duration        Pertinent Vitals/Pain Low grade temp, decreased      Swallow Study Prior Functional Status   see hhx    General Date of Onset: 09/24/14 Other Pertinent Information: The patient is a 81 y.o. year-old female with history of stroke with left hemi-neglect, coronary artery disease, atrial fibrillation on pradaxa, hypertension, possible chronic diastolic heart failure (on lasix), moderate to severe dementia with behavioral disturbance, chronic kidney disease stage III, hiatal hernia, urinary incontinence who presented with nausea, vomiting, chronic cough and lower extremity swelling per MD note.  She lives at home with her daughter who is her primary caretaker per MD note.Chest x-ray demonstrated bilateral chronic interstitial thickening with an element of possible mild interstitial edema or infection. She was admitted for possible aspiration pneumonia. Pt has a LARGE HIATAL HERNIA per CXR.  Swallow evaluation ordered.   Type of Study: Bedside swallow evaluation Previous Swallow Assessment: none Diet Prior to this Study: Dysphagia 1 (puree);Thin liquids Temperature Spikes Noted: Yes Respiratory Status: Supplemental O2 delivered via (comment) History of Recent Intubation: No Behavior/Cognition: Alert;Cooperative;Pleasant mood Oral Cavity - Dentition: Poor condition Self-Feeding Abilities: Able to feed self Patient  Positioning: Upright in chair/Tumbleform Baseline Vocal Quality: Normal Volitional  Cough: Strong Volitional Swallow: Able to elicit    Oral/Motor/Sensory Function Overall Oral Motor/Sensory Function: Appears within functional limits for tasks assessed (? sluggish palatal elevation, could not visualize adequately secondary to pt BOT elevation with attempts) Facial Symmetry:  (left eye does not open as well as right)   Ice Chips Ice chips: Not tested   Thin Liquid Thin Liquid: Within functional limits Presentation: Cup;Straw;Self Fed    Nectar Thick Nectar Thick Liquid: Not tested   Honey Thick Honey Thick Liquid: Not tested   Puree Puree: Within functional limits Presentation: Self Fed;Spoon   Solid   GO    Solid: Within functional limits Presentation: Self Fed Other Comments: minimal amount of oral residuals that pt is aware of and clears independently       Donavan Burnet, MS [facility] SLP [phone]

## 2014-09-24 NOTE — Care Management Note (Addendum)
Case Management Note  Patient Details  Name: Carolyn Sanders MRN: [id number] Date of Birth: 12/11/1925  Subjective/Objective:                81 yo admitted with CAP    Action/Plan: From home with daughter  Expected Discharge Date:                  Expected Discharge Plan:  Home w Home Health Services  In-House Referral:     Discharge planning Services  CM Consult  Post Acute Care Choice:    Choice offered to:     DME Arranged:    DME Agency:     HH Arranged:    HH Agency:     Status of Service:  In process, will continue to follow  Medicare Important Message Given:  Yes-second notification given Date Medicare IM Given:    Medicare IM give by:    Date Additional Medicare IM Given:    Additional Medicare Important Message give by:     If discussed at Long Length of Stay Meetings, dates discussed:    Additional Comments: PT is recommending HHPT/ 24 hr supervision. Pt lives with daughter full time. This CM called pt daughter and left message to offer choice for [facility] services as pt has dementia. I will await return phone call from daughter.  1615 This CM spoke with pt's daughter who states that HHPT would not be a good idea for pt because with her alzheimer's she will just forget everything. Daughter politely declines at this time.  Bartholome BillCLEMENTS, Keonte Daubenspeck H, RN 09/24/2014, 1:58 PM

## 2014-09-24 NOTE — Progress Notes (Signed)
  Echocardiogram 2D Echocardiogram has been performed.  Leta JunglingCooper, Makenzie Vittorio M 09/24/2014, 3:49 PM

## 2014-09-24 NOTE — Progress Notes (Signed)
[facility] PROGRESS NOTE  Carolyn Sanders:[id number] DOB: September 30, 1925 DOA: 09/22/2014 PCP: Zoila Shutter, MD  Brief Summary  The patient is a 81 y.o. year-old female with history of stroke with left hemi-neglect, coronary artery disease, atrial fibrillation on pradaxa, hypertension, possible chronic diastolic heart failure (on lasix), moderate to severe dementia with behavioral disturbance, chronic kidney disease stage III, hiatal hernia, urinary incontinence who presented with nausea, vomiting, chronic cough and lower extremity swelling. She lives at home with her daughter who is her primary caretaker.  In the emergency department, her oxygen saturations were in the low 90s on room air, blood pressure mildly elevated, sodium of 146.  Her chest x-ray demonstrated bilateral chronic interstitial thickening with an element of possible mild interstitial edema or infection.  She was admitted for possible aspiration pneumonia.    7/17:  Developed fever to 102F and antibiotics escalated  Assessment/Plan  Sepsis (fever, tachycardia) due to Aspiration vs. Community acquired pneumonia with rigors and change in mental status on 7/17.  Temperature trending down - Continue vancomycin and zosyn day 2 - Blood cultures 7/16 -  Blood cultures 7/17  - Legionella pending/Strep pneumo neg -  Sepsis protocol:  Lactic acid 1.2 -  CXR demonstrated persistent interstitial edema or thickening -  Consider abdominal imaging since no clear source of fever   Nausea, vomiting, none since admission - Antiemetics - LFTs wnl. UA neg and lipase neg -  Abdomen remains soft  Cough, differential diagnosis includes postinfectious reactive airways, acute on chronic diastolic heart failure, seasonal allergies, aspiration from progressive dementia - Swallow evaluation - dysphagia 1 diet  - Trial of albuterol  Lower extremity swelling, concerning for diastolic heart failure - Echocardiogram -  Daily weights:  stable - TED hose -  Lower extremity duplex:  Neg for DVT  Acute on chronic kidney disease stage 3, baseline creatinine 1.3, currently 1.95 - Minimize nephrotoxins and renally dose medications -  Continue IVF  CAD, chest pain free, ECG stable. Continue pradaxa, BB, statin  HTN, blood pressure mildly elevated - Continue diltiazem and metoprolol and hydralazine  A-fib, rate controlled and on anticoagulation - Continue pradaxa and BB and dilt  Prolonged QTc - Minimize medications that prolong QTc (previously tolerated seroquel)  Dementia and history of CVA with residual left hemineglect - At risk for delirium - seroquel at nighttime helps her rest per family member - Continue namenda  Gait instability and fall - Falls precautions - PT/OT recommending HH services with 24h supervision  Hypernatremia, resolved - d/c d5w  Weight loss  -  Nutrition consultation - Ensure   Diet: Dysphagia 1 Access: PIV IVF: Yes Proph: Pradaxa  Code Status:  Full code, recommended DNR due to progressive severe dementia. Filled out MOST form with family.  Aggressive measures except for feeding tube.  Continue to readdress.   Family Communication: patient and her daughter Disposition Plan:  Continue med-surg  Consultants:  None  Procedures:  CXR  Antibiotics:  Unasyn 7/16 > 7/17  Azithromycin 7/16 > 7/17  Vancomycin 7/17 >  Zosyn 7/17 >     HPI/Subjective:  Patient states she feels very well today.  Denies pain, SOB, cough, nausea, vomiting, diarrhea.  Limited by patient's dementia.    Objective: Filed Vitals:   09/23/14 1400 09/23/14 1700 09/23/14 2023 09/24/14 0448  BP: 149/86  112/51 126/44  Pulse: 98  73 72  Temp: 102 F (38.9 C) 100.5 F (38.1 C) 98.5 F (36.9 C) 97.8 F (36.6 C)  TempSrc: Oral  Oral Axillary  Resp: 18  18 16   Height:      Weight:    67.8 kg (149 lb 7.6 oz)  SpO2: 98%  97% 92%    Intake/Output Summary (Last  24 hours) at 09/24/14 1236 Last data filed at 09/24/14 [id number]  Gross per 24 hour  Intake   1770 ml  Output     50 ml  Net   1720 ml   Filed Weights   09/22/14 1948 09/23/14 0500 09/24/14 0448  Weight: 68.1 kg (150 lb 2.1 oz) 67.5 kg (148 lb 13 oz) 67.8 kg (149 lb 7.6 oz)   Body mass index is 28.26 kg/(m^2).  Exam:   General:  Thin female, No acute distress  HEENT:  NCAT, MMM  Cardiovascular:  IRRR, nl S1, S2 no mrg, 2+ pulses, warm extremities  Respiratory:  Bilateral rales, diminished, no rhonchi, no increased WOB  Abdomen:   NABS, soft, NT/ND  MSK:   Normal tone and bulk, 1+ LEE of the left extremity and trace edema of the right extremity  Neuro:  Grossly intact  Data Reviewed: Basic Metabolic Panel:  Recent Labs Lab 09/22/14 1555 09/23/14 0811 09/24/14 0432  NA 146* 142 139  K 4.3 3.9 3.7  CL 105 104 96*  CO2 31 28 32  GLUCOSE 112* 100* 118*  BUN 27* 24* 38*  CREATININE 1.31* 1.35* 1.95*  CALCIUM 9.0 9.0 8.6*   Liver Function Tests:  Recent Labs Lab 09/22/14 1555  AST 21  ALT 17  ALKPHOS 80  BILITOT 0.3  PROT 6.7  ALBUMIN 3.9    Recent Labs Lab 09/22/14 2034  LIPASE 16*   No results for input(s): AMMONIA in the last 168 hours. CBC:  Recent Labs Lab 09/22/14 1555 09/23/14 0811 09/24/14 0432  WBC 9.1 8.0 9.0  NEUTROABS 6.1  --   --   HGB 11.6* 12.8 10.1*  HCT 37.1 41.5 31.1*  MCV 97.4 98.1 94.8  PLT 222 168 199    Recent Results (from the past 240 hour(s))  Blood culture (routine x 2)     Status: None (Preliminary result)   Collection Time: 09/22/14  4:20 PM  Result Value Ref Range Status   Specimen Description BLOOD RIGHT WRIST  Final   Special Requests BOTTLES DRAWN AEROBIC AND ANAEROBIC 5CC  Final   Culture   Final    NO GROWTH < 24 HOURS [facility]    Report Status PENDING  Incomplete  Blood culture (routine x 2)     Status: None (Preliminary result)   Collection Time: 09/22/14  6:48 PM  Result  Value Ref Range Status   Specimen Description BLOOD LEFT HAND  Final   Special Requests   Final    BOTTLES DRAWN AEROBIC AND ANAEROBIC 3CC EA [facility]     Culture PENDING  Incomplete   Report Status PENDING  Incomplete  Culture, blood (routine x 2)     Status: None (Preliminary result)   Collection Time: 09/23/14  4:28 PM  Result Value Ref Range Status   Specimen Description BLOOD LEFT ARM  Final   Special Requests   Final    BOTTLES DRAWN AEROBIC AND ANAEROBIC 10CC BOTH BOTTLES   Culture PENDING  Incomplete   Report Status PENDING  Incomplete     Studies: Dg Chest 2 View  09/22/2014   CLINICAL DATA:  Confused, cough  EXAM: CHEST  2 VIEW  COMPARISON:  09/03/2014  FINDINGS: The patient is rotated to the left. There is bilateral diffuse interstitial thickening likely chronic. There is no pleural effusion, focal consolidation or pneumothorax. The heart and mediastinal contours are unremarkable. There is thoracic aortic atherosclerosis. There is a large hiatal hernia.  There are surgical clips in the right axilla.  The osseous structures are unremarkable.  IMPRESSION: 1. Bilateral chronic interstitial thickening. An element of superimposed mild interstitial edema or infection cannot be excluded. 2. Large hiatal hernia.   Electronically Signed   By: Elige Ko   On: 09/22/2014 16:14   Dg Chest Port 1 View  09/24/2014   CLINICAL DATA:  Cough and fever.  EXAM: PORTABLE CHEST - 1 VIEW  COMPARISON:  09/23/2014, 09/03/2014, 11/18/2013.  FINDINGS: Mediastinum and hilar structures are normal. Stable cardiomegaly. Chronic interstitial lung disease with superimposed mild interstitial prominence suggesting active interstitial lung disease such as pneumonitis or mild interstitial edema. No pleural effusion. Left costophrenic angle not imaged. No pneumothorax. Hiatal hernia again noted. Surgical clips right axilla.  IMPRESSION: 1. Chronic interstitial lung disease with probable  superimposed mild active interstitial process such as pneumonitis or mild interstitial edema. 2. Stable cardiomegaly . 3. Hiatal hernia again noted.   Electronically Signed   By: Maisie Fus  Register   On: 09/24/2014 07:24   Dg Chest Port 1 View  09/23/2014   CLINICAL DATA:  Sepsis, fever  EXAM: PORTABLE CHEST - 1 VIEW  COMPARISON:  09/22/2014  FINDINGS: Chronic interstitial markings. Superimposed mild interstitial edema is possible. No pleural effusion or pneumothorax.  Cardiomegaly.  IMPRESSION: Chronic interstitial markings with possible mild interstitial edema, unchanged.   Electronically Signed   By: Charline Bills M.D.   On: 09/23/2014 16:53    Scheduled Meds: . dabigatran  75 mg Oral Q12H  . darifenacin  15 mg Oral Daily  . diltiazem  180 mg Oral Daily  . docusate sodium  200 mg Oral Daily  . famotidine  20 mg Oral BID  . feeding supplement (ENSURE ENLIVE)  237 mL Oral BID BM  . hydrALAZINE  50 mg Oral BID  . memantine  28 mg Oral Daily  . metoprolol  50 mg Oral BID  . multivitamin with minerals  1 tablet Oral Daily  . pantoprazole  40 mg Oral Daily  . piperacillin-tazobactam (ZOSYN)  IV  2.25 g Intravenous 4 times per day  . polyethylene glycol  17 g Oral TID  . QUEtiapine  12.5 mg Oral QHS  . senna  2 tablet Oral QHS  . simvastatin  10 mg Oral Daily  . vancomycin  750 mg Intravenous Q24H   Continuous Infusions: . dextrose 5 % and 0.45 % NaCl with KCl 10 mEq/L 75 mL/hr at 09/24/14 1044    Active Problems:   CAP (community acquired pneumonia)   Chronic renal disease, stage 3, moderately decreased glomerular filtration rate (GFR) between 30-59 mL/min/1.73 square meter   A-fib   Dementia   Chronic atrial fibrillation   Lower extremity edema   Hypernatremia   Cough   Sepsis    Time spent: 30 min    Maddux Vanscyoc, [facility]  [facility] Pager [phone]. If 7PM-7AM, please contact night-coverage at [email or web address], password [facility] 09/24/2014, 12:36 PM  LOS: 2 days

## 2014-09-24 NOTE — Progress Notes (Signed)
VASCULAR LAB PRELIMINARY  PRELIMINARY  PRELIMINARY  PRELIMINARY  Left lower extremity venous duplex completed.    Preliminary report:  Left:  No evidence of DVT, superficial thrombosis, or Baker's cyst.  Carolyn Sanders, RVS 09/24/2014, 8:36 AM

## 2014-09-24 NOTE — Progress Notes (Signed)
ANTIBIOTIC CONSULT NOTE   Pharmacy Consult for zosyn/vancomycin Indication: Possible Aspiration Pneumonia  No Known Allergies  Patient Measurements: Height: 5\' 1"  (154.9 cm) Weight: 149 lb 7.6 oz (67.8 kg) IBW/kg (Calculated) : 47.8  Vital Signs: Temp: 97.8 F (36.6 C) (07/18 0448) Temp Source: Axillary (07/18 0448) BP: 126/44 mmHg (07/18 0448) Pulse Rate: 72 (07/18 0448)  Labs:  Recent Labs  09/22/14 1555 09/23/14 0811 09/24/14 0432  WBC 9.1 8.0 9.0  HGB 11.6* 12.8 10.1*  PLT 222 168 199  CREATININE 1.31* 1.35* 1.95*   Estimated Creatinine Clearance: 18.2 mL/min (by C-G formula based on Cr of 1.95). No results for input(s): VANCOTROUGH, VANCOPEAK, VANCORANDOM, GENTTROUGH, GENTPEAK, GENTRANDOM, TOBRATROUGH, TOBRAPEAK, TOBRARND, AMIKACINPEAK, AMIKACINTROU, AMIKACIN in the last 72 hours.   Medical History: Past Medical History  Diagnosis Date  . Stroke     residual left hemineglect  . Coronary artery disease   . Atrial fibrillation   . Hypertension   . Dementia   . Hiatal hernia     Assessment: 89 YOMF with possible PNA, on 7/16 started Unasyn for aspiration PNA vs. CAP, plus azithromycin for atypical coverage. On 7/17, antibiotics changed to zosyn and vancomycin for possible sepsis (fever of 102).   7/16 >> unasyn >> 7/17 7/16 >> azith (MD) >> 7/17 7/17 >> zosyn >> 7/17 >> vancomycin >>  Renal: SCr increasing, today SCr 1.95. CrCl~18 ml/min. UOP 0.5 mL/kg/hr yesterday.  7/16 blood: NGTD Strep Ag: negative Legionella Ag: IP  Plan:  1.  Continue vancomcyin 750mg  IV q24h for now but will adjust to q48h dosing if SCr continues to increase.  Patient has essentially received a 1.5g loading dose so far.  Next dose due tomorrow. 2.  Change Zosyn to 2.25g IV q6h. 3.  F/u culture results, SCr in AM.  Clance Bollunyon, Ossie Yebra 09/24/2014,9:30 AM

## 2014-09-25 DIAGNOSIS — J690 Pneumonitis due to inhalation of food and vomit: Principal | ICD-10-CM

## 2014-09-25 LAB — CBC
HCT: 34 % — ABNORMAL LOW (ref 36.0–46.0)
Hemoglobin: 10.8 g/dL — ABNORMAL LOW (ref 12.0–15.0)
MCH: 30.6 pg (ref 26.0–34.0)
MCHC: 31.8 g/dL (ref 30.0–36.0)
MCV: 96.3 fL (ref 78.0–100.0)
Platelets: 196 10*3/uL (ref 150–400)
RBC: 3.53 MIL/uL — ABNORMAL LOW (ref 3.87–5.11)
RDW: 14.2 % (ref 11.5–15.5)
WBC: 9.2 10*3/uL (ref 4.0–10.5)

## 2014-09-25 LAB — BASIC METABOLIC PANEL
Anion gap: 8 (ref 5–15)
BUN: 40 mg/dL — AB (ref 6–20)
CHLORIDE: 101 mmol/L (ref 101–111)
CO2: 30 mmol/L (ref 22–32)
CREATININE: 1.77 mg/dL — AB (ref 0.44–1.00)
Calcium: 8.4 mg/dL — ABNORMAL LOW (ref 8.9–10.3)
GFR calc non Af Amer: 25 mL/min — ABNORMAL LOW (ref >60)
GFR, EST AFRICAN AMERICAN: 29 mL/min — AB (ref >60)
GLUCOSE: 118 mg/dL — AB (ref 65–99)
POTASSIUM: 4 mmol/L (ref 3.5–5.1)
SODIUM: 139 mmol/L (ref 135–145)

## 2014-09-25 LAB — LEGIONELLA ANTIGEN, URINE

## 2014-09-25 MED ORDER — AMOXICILLIN-POT CLAVULANATE 250-62.5 MG/5ML PO SUSR: 500.0000 mg | Freq: Two times a day (BID) | ORAL | Status: DC

## 2014-09-25 MED ORDER — FAMOTIDINE 20 MG PO TABS: 20.0000 mg | ORAL_TABLET | Freq: Every day | ORAL | Status: DC

## 2014-09-25 MED ORDER — ENSURE ENLIVE PO LIQD: 237.0000 mL | Freq: Two times a day (BID) | ORAL | Status: AC

## 2014-09-25 MED ORDER — SACCHAROMYCES BOULARDII 250 MG PO CAPS: 250.0000 mg | ORAL_CAPSULE | Freq: Two times a day (BID) | ORAL | Status: DC

## 2014-09-25 NOTE — Discharge Summary (Signed)
Physician Discharge Summary  Carolyn Sanders:[id number] DOB: [birth year] DOA: 09/22/2014  PCP: Carolyn Shutter, MD  Admit date: 09/22/2014 Discharge date: 09/25/2014  Recommendations for Outpatient Follow-up:  1. F/u with primary care doctor in 1-2 weeks for reevaluation and review of pending tests, including blood cultures.  Please continue to address goals of care.    Discharge Diagnoses:  Principal Problem:   Aspiration pneumonia Active Problems:   CAP (community acquired pneumonia)   Chronic renal disease, stage 3, moderately decreased glomerular filtration rate (GFR) between 30-59 mL/min/1.73 square meter   A-fib   Dementia   Chronic atrial fibrillation   Lower extremity edema   Hypernatremia   Cough   Sepsis   Discharge Condition: stable, improved  Diet recommendation: regular, then, whole meds with liquid, slow, small bites and sips  Wt Readings from Last 3 Encounters:  09/25/14 69.9 kg (154 lb 1.6 oz)  06/06/14 67.858 kg (149 lb 9.6 oz)  05/16/14 67.858 kg (149 lb 9.6 oz)    History of present illness:   The patient is a 81 y.o. year-old female with history of stroke with left hemi-neglect, coronary artery disease, atrial fibrillation on pradaxa, hypertension, possible chronic diastolic heart failure (on lasix), moderate to severe dementia with behavioral disturbance, chronic kidney disease stage III, hiatal hernia, urinary incontinence who presented with nausea, vomiting, chronic cough and lower extremity swelling. She lives at home with her daughter who is her primary caretaker. In the emergency department, her oxygen saturations were in the low 90s on room air, blood pressure mildly elevated, sodium of 146. Her chest x-ray demonstrated bilateral chronic interstitial thickening with an element of possible mild interstitial edema or infection. She was admitted for possible aspiration pneumonia.   7/17: Developed fever to 102F and antibiotics escalated 719:   No fever in 48 hours, cultures negative so far.  Changed to augmentin for presumptive aspiration pneumonia to complete a 7-day course of antibiotics.    Hospital Course:   Sepsis (fever, tachycardia) due to Aspiration vs. Community acquired pneumonia.  She was initially started on unasyn and azithromycin, but on the second day of admission, she developed fever to 102F, rigors and change in mental status.  Her antibiotics were escalted to vancomycin and zosyn.  Her temperature trended down.  Legionella neg, Strep pneumo neg.  Lactic acid was 1.2.  Repeat CXR demonstrated persistent interstitial edema or thickening.  Her abdomen remained nontender.  UA was negative.  Her initial and repeated cultures remained no growth to date.  Most likely, she has aspiration pneumonia.  She was seen by speech therapy who recommended a regular diet as tolerated with thin liquids.  She was well-appearing at the time of discharge, tolerating a regular diet, afebrile, without leukocytosis.  Nausea, vomiting, none since admission.  LFTs wnl. UA neg and lipase neg.  Abdomen remained soft.  No diarrhea.  Cough, differential diagnosis includes postinfectious reactive airways, acute on chronic diastolic heart failure, seasonal allergies, aspiration from progressive dementia.  Swallow evaluation was performed and recommended regular diet with thin liquids.  Her cough improved.    Lower extremity swelling, concerning for diastolic heart failure.  Echocardiogram demonstrated evidence of diastolic dysfunction, so most likely she has chronic diastolic heart failure.  Lower extremity duplex: Neg for DVT.  Due to limited PO intake and rising BUN, suggested that her family monitor for worsening swelling, or wheezing/SOB and contact her primary care doctor right away if they notice these symptoms.  She should elevate her legs  at rest and use TED hose.    Acute on chronic kidney disease stage 3, baseline creatinine 1.3, currently 1.95,  trended down to 1.77.  CAD, chest pain free, ECG stable. Continued pradaxa, BB, statin.  HTN, blood pressure mildly elevated initially but trended down.  Continue diltiazem and metoprolol and hydralazine  A-fib, rate controlled and on anticoagulation.  Continued pradaxa and BB and diltizem.    Prolonged QTc, continue to avoid medications that prolong QTc.  Dementia and history of CVA with residual left hemineglect. Continued namenda.  She was given seroquel nightly during her hospitalization.  Given her weight loss, hypernatremia on admission, possible early dysphagia, discussed with family that Carolyn Sanders may be entering the later stages of dementia.  We discussed goals of care.  I recommended DNR/DNI secondary to her frailty and progressive dementia, however, the family asked that she remain full code.  We filled out the MOST form, and the family asked for all aggressive measures except for feeding tube.    Gait instability and fall, falls precautions.  Family declined home health secondary to patient's dementia.    Hypernatremia, resolved with D5W.    Weight loss, continue supplements.    Consultants:  None  Procedures:  CXR  Antibiotics:  Unasyn 7/16 > 7/17  Azithromycin 7/16 > 7/17  Vancomycin 7/17 >  Zosyn 7/17 >  Discharge Exam: Filed Vitals:   09/25/14 0544  BP: 115/52  Pulse: 77  Temp: 98.2 F (36.8 C)  Resp: 16   Filed Vitals:   09/24/14 0448 09/24/14 1350 09/24/14 2152 09/25/14 0544  BP: 126/44 114/51 115/56 115/52  Pulse: 72 73 90 77  Temp: 97.8 F (36.6 C) 98.4 F (36.9 C) 98.4 F (36.9 C) 98.2 F (36.8 C)  TempSrc: Axillary Oral  Oral  Resp: 16 16  16   Height:      Weight: 67.8 kg (149 lb 7.6 oz)   69.9 kg (154 lb 1.6 oz)  SpO2: 92% 100% 97% 92%     General: Thin female, No acute distress, sitting in chair, HOH  HEENT: NCAT, MMM  Cardiovascular: IRRR, nl S1, S2 no mrg, 2+ pulses, warm extremities  Respiratory: Bilateral  rales, diminished, no rhonchi, no increased WOB  Abdomen: NABS, soft, NT/ND  MSK: Normal tone and bulk, 1+ LEE of the left extremity and trace edema of the right extremity  Neuro: Grossly intact  Discharge Instructions      Discharge Instructions    (HEART FAILURE PATIENTS) Call MD:  Anytime you have any of the following symptoms: 1) 3 pound weight gain in 24 hours or 5 pounds in 1 week 2) shortness of breath, with or without a dry hacking cough 3) swelling in the hands, feet or stomach 4) if you have to sleep on extra pillows at night in order to breathe.    Complete by:  As directed      Call MD for:  difficulty breathing, headache or visual disturbances    Complete by:  As directed      Call MD for:  extreme fatigue    Complete by:  As directed      Call MD for:  hives    Complete by:  As directed      Call MD for:  persistant dizziness or light-headedness    Complete by:  As directed      Call MD for:  persistant nausea and vomiting    Complete by:  As directed  Call MD for:  severe uncontrolled pain    Complete by:  As directed      Call MD for:  temperature >100.4    Complete by:  As directed      Diet general    Complete by:  As directed      Discharge instructions    Complete by:  As directed   Please take augmentin twice a day, next dose tomorrow, for the next three days.  Her last dose will by on 7/22 in the evening, then stop.  There will still be a little medication left in the bottle which you may discard.  If she has worsening shortness of breath, fevers, or other concerning symptoms, please return to the hospital right away or call 911.     Increase activity slowly    Complete by:  As directed             Medication List    TAKE these medications        acetaminophen 325 MG tablet  Commonly known as:  TYLENOL  Take 2 tablets (650 mg total) by mouth every 6 (six) hours as needed for mild pain (or Fever >/= 101).     amoxicillin-clavulanate  250-62.5 MG/5ML suspension  Commonly known as:  AUGMENTIN  Take 10 mLs (500 mg total) by mouth 2 (two) times daily.     dabigatran 75 MG Caps capsule  Commonly known as:  PRADAXA  Take 75 mg by mouth every 12 (twelve) hours.     diltiazem 180 MG 24 hr capsule  Commonly known as:  DILACOR XR  Take 180 mg by mouth daily.     docusate sodium 100 MG capsule  Commonly known as:  COLACE  Take 200 mg by mouth daily.     feeding supplement (ENSURE ENLIVE) Liqd  Take 237 mLs by mouth 2 (two) times daily between meals.     furosemide 20 MG tablet  Commonly known as:  LASIX  Take 20 mg by mouth every other day.     hydrALAZINE 50 MG tablet  Commonly known as:  APRESOLINE  Take 50 mg by mouth 2 (two) times daily.     memantine 28 MG Cp24 24 hr capsule  Commonly known as:  NAMENDA XR  Take 28 mg by mouth daily.     metoprolol 50 MG tablet  Commonly known as:  LOPRESSOR  Take 50 mg by mouth 2 (two) times daily.     multivitamin with minerals Tabs tablet  Take 1 tablet by mouth daily.     pantoprazole 40 MG tablet  Commonly known as:  PROTONIX  Take 40 mg by mouth daily.     polyethylene glycol packet  Commonly known as:  MIRALAX / GLYCOLAX  Take 17 g by mouth 2 (two) times daily.     saccharomyces boulardii 250 MG capsule  Commonly known as:  FLORASTOR  Take 1 capsule (250 mg total) by mouth 2 (two) times daily.     simvastatin 10 MG tablet  Commonly known as:  ZOCOR  Take 10 mg by mouth daily.     solifenacin 10 MG tablet  Commonly known as:  VESICARE  Take 10 mg by mouth daily.       Follow-up Information    Follow up with [facility], MD. Schedule an appointment as soon as possible for a visit in 1 week.   Specialty:  Internal Medicine   Contact information:   [address] [place] [address] [phone]       Follow up with Holley Raring, MD.   Specialty:  Cardiology   Why:  As needed   Contact information:   [address] [place] [address] [phone]        The results of significant diagnostics from this hospitalization (including imaging, microbiology, ancillary and laboratory) are listed below for reference.    Significant Diagnostic Studies: Dg Chest 2 View  09/22/2014   CLINICAL DATA:  Confused, cough  EXAM: CHEST  2 VIEW  COMPARISON:  09/03/2014  FINDINGS: The patient is rotated to the left. There is bilateral diffuse interstitial thickening likely chronic. There is no pleural effusion, focal consolidation or pneumothorax. The heart and mediastinal contours are unremarkable. There is thoracic aortic atherosclerosis. There is a large hiatal hernia.  There are surgical clips in the right axilla.  The osseous structures are unremarkable.  IMPRESSION: 1. Bilateral chronic interstitial thickening. An element of superimposed mild interstitial edema or infection cannot be excluded. 2. Large hiatal hernia.   Electronically Signed   By: Elige Ko   On: 09/22/2014 16:14   Dg Chest 2 View  09/03/2014   CLINICAL DATA:  Cough and dyspnea periodically, worsened tonight.  EXAM: CHEST  2 VIEW  COMPARISON:  08/17/2014  FINDINGS: There is a large hiatal hernia. There is mild vascular fullness which is unchanged from 08/17/2014 and this is probably chronic as it also is visible on numerous prior chest radiographs. No acute infiltrate or acute CHF is evident. There are no effusions.  IMPRESSION: Large hiatal hernia. Chronic-appearing vascular prominence. No conclusive acute cardiopulmonary findings.   Electronically Signed   By: Ellery Plunk M.D.   On: 09/03/2014 02:31   Dg Abd 1 View  09/03/2014   CLINICAL DATA:  Periodic cough and dyspnea, much worse tonight. Question bowel obstruction  EXAM: ABDOMEN - 1 VIEW  COMPARISON:  05/08/2014  FINDINGS: There is a large volume of stool throughout the colon. There is no evidence of a bowel obstruction. There is no extraluminal air.  IMPRESSION: Large volume colonic stool.  Negative for obstruction or perforation.   Electronically Signed   By: Ellery Plunk M.D.   On: 09/03/2014 04:49   Dg Chest Port 1 View  09/24/2014   CLINICAL DATA:  Cough and fever.  EXAM: PORTABLE CHEST - 1 VIEW  COMPARISON:  09/23/2014, 09/03/2014, 11/18/2013.  FINDINGS: Mediastinum and hilar structures are normal. Stable cardiomegaly. Chronic interstitial lung disease with superimposed mild interstitial prominence suggesting active interstitial lung disease such as pneumonitis or mild interstitial edema. No pleural effusion. Left costophrenic angle not imaged. No pneumothorax. Hiatal hernia again noted. Surgical clips right axilla.  IMPRESSION: 1. Chronic interstitial lung disease with probable superimposed mild active interstitial process such as pneumonitis or mild interstitial edema. 2. Stable cardiomegaly . 3. Hiatal hernia again noted.   Electronically Signed   By: Maisie Fus  Register   On: 09/24/2014 07:24   Dg Chest Port 1 View  09/23/2014   CLINICAL DATA:  Sepsis, fever  EXAM: PORTABLE CHEST - 1 VIEW  COMPARISON:  09/22/2014  FINDINGS: Chronic interstitial markings. Superimposed mild interstitial edema is possible. No pleural effusion or pneumothorax.  Cardiomegaly.  IMPRESSION: Chronic interstitial markings with possible mild interstitial edema, unchanged.   Electronically Signed   By: Charline Bills M.D.   On: 09/23/2014 16:53    Microbiology: Recent Results (from the past 240 hour(s))  Blood culture (routine x 2)  Status: None (Preliminary result)   Collection Time: 09/22/14  4:20 PM  Result Value Ref Range Status   Specimen Description BLOOD RIGHT WRIST  Final   Special Requests BOTTLES DRAWN AEROBIC AND ANAEROBIC 5CC  Final   Culture   Final    NO GROWTH 2 DAYS [facility]    Report Status PENDING  Incomplete  Blood culture (routine x 2)     Status: None (Preliminary result)   Collection Time: 09/22/14  6:48 PM  Result Value Ref Range Status    Specimen Description BLOOD LEFT HAND  Final   Special Requests BOTTLES DRAWN AEROBIC AND ANAEROBIC 3CC EA  Final   Culture   Final    NO GROWTH 1 DAY Performed [facility]    Report Status PENDING  Incomplete  Culture, blood (routine x 2)     Status: None (Preliminary result)   Collection Time: 09/23/14  4:28 PM  Result Value Ref Range Status   Specimen Description BLOOD LEFT ARM  Final   Special Requests   Final    BOTTLES DRAWN AEROBIC AND ANAEROBIC 10CC BOTH BOTTLES   Culture   Final    NO GROWTH 2 DAYS Performed at [facility]    Report Status PENDING  Incomplete     Labs: Basic Metabolic Panel:  Recent Labs Lab 09/22/14 1555 09/23/14 0811 09/24/14 0432 09/25/14 0417  NA 146* 142 139 139  K 4.3 3.9 3.7 4.0  CL 105 104 96* 101  CO2 31 28 32 30  GLUCOSE 112* 100* 118* 118*  BUN 27* 24* 38* 40*  CREATININE 1.31* 1.35* 1.95* 1.77*  CALCIUM 9.0 9.0 8.6* 8.4*   Liver Function Tests:  Recent Labs Lab 09/22/14 1555  AST 21  ALT 17  ALKPHOS 80  BILITOT 0.3  PROT 6.7  ALBUMIN 3.9    Recent Labs Lab 09/22/14 2034  LIPASE 16*   No results for input(s): AMMONIA in the last 168 hours. CBC:  Recent Labs Lab 09/22/14 1555 09/23/14 0811 09/24/14 0432 09/25/14 0417  WBC 9.1 8.0 9.0 9.2  NEUTROABS 6.1  --   --   --   HGB 11.6* 12.8 10.1* 10.8*  HCT 37.1 41.5 31.1* 34.0*  MCV 97.4 98.1 94.8 96.3  PLT 222 168 199 196   Cardiac Enzymes: No results for input(s): CKTOTAL, CKMB, CKMBINDEX, TROPONINI in the last 168 hours. BNP: BNP (last 3 results)  Recent Labs  05/08/14 2253 09/03/14 0403 09/22/14 1830  BNP 457.4* 280.6* 160.5*    ProBNP (last 3 results)  Recent Labs  11/18/13 2157  PROBNP 1073.0*    CBG:  Recent Labs Lab 09/23/14 1358  GLUCAP 118*    Time coordinating discharge: 35 minutes  Signed:  Julizza Sassone  [facility] 09/25/2014, 1:34 PM

## 2014-09-25 NOTE — Progress Notes (Signed)
Patient's daughter called to pick patient up. States she is coming but she will just wait downstairs, patient has dementia and unable to get discharge instructions, daughter said it is fine to just send the discharge instructions with patient.

## 2014-09-27 LAB — CULTURE, BLOOD (ROUTINE X 2): CULTURE: NO GROWTH

## 2014-09-28 LAB — CULTURE, BLOOD (ROUTINE X 2)
CULTURE: NO GROWTH
CULTURE: NO GROWTH

## 2014-11-03 ENCOUNTER — Encounter (HOSPITAL_BASED_OUTPATIENT_CLINIC_OR_DEPARTMENT_OTHER): Payer: Self-pay | Admitting: *Deleted

## 2014-11-03 ENCOUNTER — Emergency Department (HOSPITAL_BASED_OUTPATIENT_CLINIC_OR_DEPARTMENT_OTHER)
Admission: EM | Admit: 2014-11-03 | Discharge: 2014-11-03 | Disposition: A | Discharge status: Home / Self Care | Payer: Medicare Other | Attending: Physician Assistant | Admitting: Physician Assistant

## 2014-11-03 ENCOUNTER — Emergency Department (HOSPITAL_BASED_OUTPATIENT_CLINIC_OR_DEPARTMENT_OTHER): Payer: Medicare Other

## 2014-11-03 DIAGNOSIS — F0390 Unspecified dementia without behavioral disturbance: Secondary | ICD-10-CM | POA: Insufficient documentation

## 2014-11-03 DIAGNOSIS — Z8673 Personal history of transient ischemic attack (TIA), and cerebral infarction without residual deficits: Secondary | ICD-10-CM | POA: Diagnosis not present

## 2014-11-03 DIAGNOSIS — Y9389 Activity, other specified: Secondary | ICD-10-CM | POA: Diagnosis not present

## 2014-11-03 DIAGNOSIS — Z79899 Other long term (current) drug therapy: Secondary | ICD-10-CM | POA: Insufficient documentation

## 2014-11-03 DIAGNOSIS — Y998 Other external cause status: Secondary | ICD-10-CM | POA: Insufficient documentation

## 2014-11-03 DIAGNOSIS — Y9289 Other specified places as the place of occurrence of the external cause: Secondary | ICD-10-CM | POA: Insufficient documentation

## 2014-11-03 DIAGNOSIS — S60561A Insect bite (nonvenomous) of right hand, initial encounter: Secondary | ICD-10-CM | POA: Insufficient documentation

## 2014-11-03 DIAGNOSIS — W57XXXA Bitten or stung by nonvenomous insect and other nonvenomous arthropods, initial encounter: Secondary | ICD-10-CM | POA: Diagnosis not present

## 2014-11-03 DIAGNOSIS — Z8719 Personal history of other diseases of the digestive system: Secondary | ICD-10-CM | POA: Insufficient documentation

## 2014-11-03 DIAGNOSIS — I2510 Atherosclerotic heart disease of native coronary artery without angina pectoris: Secondary | ICD-10-CM | POA: Insufficient documentation

## 2014-11-03 DIAGNOSIS — I10 Essential (primary) hypertension: Secondary | ICD-10-CM | POA: Diagnosis not present

## 2014-11-03 DIAGNOSIS — M79641 Pain in right hand: Secondary | ICD-10-CM

## 2014-11-03 MED ORDER — ACETAMINOPHEN 325 MG PO TABS
650.0000 mg | ORAL_TABLET | Freq: Once | ORAL | Status: AC
  Administered 2014-11-03: 650 mg via ORAL
  Filled 2014-11-03: qty 2

## 2014-11-03 MED ORDER — CEPHALEXIN 250 MG PO CAPS: 250.0000 mg | ORAL_CAPSULE | Freq: Four times a day (QID) | ORAL | Status: DC

## 2014-11-03 NOTE — Discharge Instructions (Signed)
We don't know what causes the redness of the hand. Use these antibiotics only if it does not get better after 24 hours.  Likely this was a bee sting.

## 2014-11-03 NOTE — ED Provider Notes (Signed)
CSN: [id number]     Arrival date & time 11/03/14  1710 History  This chart was scribed for Antwon Rochin Randall An, MD by Budd Palmer, ED Scribe. This patient was seen in room MHT13/MHT13 and the patient's care was started at 7:17 PM.    Chief Complaint  Patient presents with  . Insect Bite   The history is provided by the patient and a relative. No language interpreter was used.   HPI Comments: Carolyn Sanders is a 81 y.o. female with a PMHx of Dementia, HTN, and CAD who presents to the Emergency Department complaining of an insect bite to the rigth palm sustained 3 hours ago. Per daughter she was going down the stairs outside when her hand was on the railing and she was bitten by an insect. She reports associated soreness, swelling, and pain to the area. Pt denies itching.  Past Medical History  Diagnosis Date  . Stroke     residual left hemineglect  . Coronary artery disease   . Atrial fibrillation   . Hypertension   . Dementia   . Hiatal hernia    Past Surgical History  Procedure Laterality Date  . Carotid endarterectomy     Family History  Problem Relation Age of Onset  . Breast cancer Mother    Social History  Substance Use Topics  . Smoking status: Never Smoker   . Smokeless tobacco: Never Used  . Alcohol Use: No   OB History    No data available     Review of Systems  Skin: Positive for color change.  All other systems reviewed and are negative.   Allergies  Review of patient's allergies indicates no known allergies.  Home Medications   Prior to Admission medications   Medication Sig Start Date End Date Taking? Authorizing Provider  acetaminophen (TYLENOL) 325 MG tablet Take 2 tablets (650 mg total) by mouth every 6 (six) hours as needed for mild pain (or Fever >/= 101). 05/11/14   Jeralyn Bennett, MD  amoxicillin-clavulanate (AUGMENTIN) 250-62.5 MG/5ML suspension Take 10 mLs (500 mg total) by mouth 2 (two) times daily. 09/25/14   Renae Fickle, MD   dabigatran (PRADAXA) 75 MG CAPS Take 75 mg by mouth every 12 (twelve) hours.      Historical Provider, MD  diltiazem (DILACOR XR) 180 MG 24 hr capsule Take 180 mg by mouth daily.    Historical Provider, MD  docusate sodium (COLACE) 100 MG capsule Take 200 mg by mouth daily.     Historical Provider, MD  feeding supplement, ENSURE ENLIVE, (ENSURE ENLIVE) LIQD Take 237 mLs by mouth 2 (two) times daily between meals. 09/25/14   Renae Fickle, MD  furosemide (LASIX) 20 MG tablet Take 20 mg by mouth every other day.    Historical Provider, MD  hydrALAZINE (APRESOLINE) 50 MG tablet Take 50 mg by mouth 2 (two) times daily.     Historical Provider, MD  memantine (NAMENDA XR) 28 MG CP24 24 hr capsule Take 28 mg by mouth daily.    Historical Provider, MD  metoprolol (LOPRESSOR) 50 MG tablet Take 50 mg by mouth 2 (two) times daily.    Historical Provider, MD  Multiple Vitamin (MULTIVITAMIN WITH MINERALS) TABS tablet Take 1 tablet by mouth daily.    Historical Provider, MD  pantoprazole (PROTONIX) 40 MG tablet Take 40 mg by mouth daily.    Historical Provider, MD  polyethylene glycol (MIRALAX / GLYCOLAX) packet Take 17 g by mouth 2 (two) times daily. Patient taking [id number]:  Take 17 g by mouth 3 (three) times daily as needed for mild constipation.  04/18/14   Calvert Cantor, MD  saccharomyces boulardii (FLORASTOR) 250 MG capsule Take 1 capsule (250 mg total) by mouth 2 (two) times daily. 09/25/14   Renae Fickle, MD  simvastatin (ZOCOR) 10 MG tablet Take 10 mg by mouth daily.    Historical Provider, MD  solifenacin (VESICARE) 10 MG tablet Take 10 mg by mouth daily.    Historical Provider, MD   BP 160/80 mmHg  Pulse 84  Temp(Src) 98.2 F (36.8 C) (Oral)  Resp 18  Ht 5\' 1"  (1.549 m)  Wt 145 lb (65.772 kg)  BMI 27.41 kg/m2  SpO2 95% Physical Exam  Constitutional: She appears well-developed and well-nourished.  HENT:  Head: Normocephalic and atraumatic.  Eyes: Conjunctivae are normal. Right eye  exhibits no discharge. Left eye exhibits no discharge.  Pulmonary/Chest: Effort normal. No respiratory distress.  Neurological: She is alert. Coordination normal.  Skin: Skin is warm and dry. No rash noted. She is not diaphoretic. There is erythema.  2x2 cm area of erythema to the R pal. No induration, no abscess, no bony tenderness. No obvious bite.  Psychiatric: She has a normal mood and affect.  Nursing note and vitals reviewed.   ED Course  Procedures  DIAGNOSTIC STUDIES: Oxygen Saturation is 95% on RA, adequate by my interpretation.    COORDINATION OF CARE: 7:20 PM - Discussed plans to order diagnostic imaging to r/o hand fracture. Pt advised of plan for treatment and pt agrees.  Labs Review Labs Reviewed - No data to display  Imaging Review No results found. I have personally reviewed and evaluated these images and lab results as part of my medical decision-making.   EKG Interpretation None      MDM   Final diagnoses:  None    Patient is a 81 year old demented female presenting with erythema on her right palm. Patient's caretaker said it started today after she was gripping a metal pole. They were concerned about an insect bite. She is very minimal swelling and minimal erythema. Do not suspect any kind of abscess or fracture or anything underlying. It is nontender to the touch no fluctuance. We'll get an x-ray. But most likely will just sent home with Tylenol.  I personally performed the services described in this documentation, which was scribed in my presence. The recorded information has been reviewed and is accurate.   Marissa Lowrey Randall An, MD 11/03/14 [phone]

## 2014-11-03 NOTE — ED Notes (Signed)
Redness to right palm today- ?insect bite- pt has alzheimers

## 2014-12-15 ENCOUNTER — Emergency Department (HOSPITAL_BASED_OUTPATIENT_CLINIC_OR_DEPARTMENT_OTHER)
Admission: EM | Admit: 2014-12-15 | Discharge: 2014-12-15 | Disposition: A | Discharge status: Home / Self Care | Payer: Medicare Other | Attending: Emergency Medicine | Admitting: Emergency Medicine

## 2014-12-15 ENCOUNTER — Encounter (HOSPITAL_BASED_OUTPATIENT_CLINIC_OR_DEPARTMENT_OTHER): Payer: Self-pay | Admitting: Emergency Medicine

## 2014-12-15 ENCOUNTER — Emergency Department (HOSPITAL_BASED_OUTPATIENT_CLINIC_OR_DEPARTMENT_OTHER): Payer: Medicare Other

## 2014-12-15 DIAGNOSIS — F0390 Unspecified dementia without behavioral disturbance: Secondary | ICD-10-CM | POA: Insufficient documentation

## 2014-12-15 DIAGNOSIS — Z792 Long term (current) use of antibiotics: Secondary | ICD-10-CM | POA: Insufficient documentation

## 2014-12-15 DIAGNOSIS — Z79899 Other long term (current) drug therapy: Secondary | ICD-10-CM | POA: Insufficient documentation

## 2014-12-15 DIAGNOSIS — W1839XA Other fall on same level, initial encounter: Secondary | ICD-10-CM | POA: Diagnosis not present

## 2014-12-15 DIAGNOSIS — Z8719 Personal history of other diseases of the digestive system: Secondary | ICD-10-CM | POA: Diagnosis not present

## 2014-12-15 DIAGNOSIS — Y9389 Activity, other specified: Secondary | ICD-10-CM | POA: Insufficient documentation

## 2014-12-15 DIAGNOSIS — S6991XA Unspecified injury of right wrist, hand and finger(s), initial encounter: Secondary | ICD-10-CM | POA: Diagnosis present

## 2014-12-15 DIAGNOSIS — S63501A Unspecified sprain of right wrist, initial encounter: Secondary | ICD-10-CM | POA: Insufficient documentation

## 2014-12-15 DIAGNOSIS — I10 Essential (primary) hypertension: Secondary | ICD-10-CM | POA: Insufficient documentation

## 2014-12-15 DIAGNOSIS — I2510 Atherosclerotic heart disease of native coronary artery without angina pectoris: Secondary | ICD-10-CM | POA: Diagnosis not present

## 2014-12-15 DIAGNOSIS — Y998 Other external cause status: Secondary | ICD-10-CM | POA: Diagnosis not present

## 2014-12-15 DIAGNOSIS — Z8673 Personal history of transient ischemic attack (TIA), and cerebral infarction without residual deficits: Secondary | ICD-10-CM | POA: Diagnosis not present

## 2014-12-15 DIAGNOSIS — Y9289 Other specified places as the place of occurrence of the external cause: Secondary | ICD-10-CM | POA: Insufficient documentation

## 2014-12-15 NOTE — ED Provider Notes (Signed)
CSN: [id number]     Arrival date & time 12/15/14  1831 History  By signing my name below, I, Budd Palmer, attest that this documentation has been prepared under the direction and in the presence of Rolland Porter, MD. Electronically Signed: Budd Palmer, ED Scribe. 12/15/2014. 7:12 PM.     Chief Complaint  Patient presents with  . Fall   The history is provided by the patient and a relative. No language interpreter was used.   HPI Comments: Carolyn Sanders is a 81 y.o. female with a PMHx of dementia who presents to the Emergency Department complaining of a fall that occurred just PTA. Per daughter, pt wandered off and fell in another room. She states she walked in to find the pt sitting on the floor. Daughter notes pt seems like her normal self. Pt  reports associated pain in the right wrist. Pt denies hitting her head.  Past Medical History  Diagnosis Date  . Stroke [facility])     residual left hemineglect  . Coronary artery disease   . Atrial fibrillation (HCC)   . Hypertension   . Dementia   . Hiatal hernia    Past Surgical History  Procedure Laterality Date  . Carotid endarterectomy     Family History  Problem Relation Age of Onset  . Breast cancer Mother    Social History  Substance Use Topics  . Smoking status: Never Smoker   . Smokeless tobacco: Never Used  . Alcohol Use: No   OB History    No data available     Review of Systems  Constitutional: Negative for fever, chills, diaphoresis, appetite change and fatigue.  HENT: Negative for mouth sores, sore throat and trouble swallowing.   Eyes: Negative for visual disturbance.  Respiratory: Negative for cough, chest tightness, shortness of breath and wheezing.   Cardiovascular: Negative for chest pain.  Gastrointestinal: Negative for nausea, vomiting, abdominal pain, diarrhea and abdominal distention.  Endocrine: Negative for polydipsia, polyphagia and polyuria.  Genitourinary: Negative for dysuria, frequency and  hematuria.  Musculoskeletal: Positive for arthralgias (R wrist). Negative for gait problem.  Skin: Negative for color change, pallor and rash.  Neurological: Negative for dizziness, syncope, light-headedness and headaches.  Hematological: Does not bruise/bleed easily.  Psychiatric/Behavioral: Negative for behavioral problems and confusion.    Allergies  Review of patient's allergies indicates no known allergies.  Home Medications   Prior to Admission medications   Medication Sig Start Date End Date Taking? Authorizing Provider  acetaminophen (TYLENOL) 325 MG tablet Take 2 tablets (650 mg total) by mouth every 6 (six) hours as needed for mild pain (or Fever >/= 101). 05/11/14   Jeralyn Bennett, MD  amoxicillin-clavulanate (AUGMENTIN) 250-62.5 MG/5ML suspension Take 10 mLs (500 mg total) by mouth 2 (two) times daily. 09/25/14   Renae Fickle, MD  cephALEXin (KEFLEX) 250 MG capsule Take 1 capsule (250 mg total) by mouth 4 (four) times daily. 11/03/14   Courteney Lyn Mackuen, MD  dabigatran (PRADAXA) 75 MG CAPS Take 75 mg by mouth every 12 (twelve) hours.      Historical Provider, MD  diltiazem (DILACOR XR) 180 MG 24 hr capsule Take 180 mg by mouth daily.    Historical Provider, MD  docusate sodium (COLACE) 100 MG capsule Take 200 mg by mouth daily.     Historical Provider, MD  feeding supplement, ENSURE ENLIVE, (ENSURE ENLIVE) LIQD Take 237 mLs by mouth 2 (two) times daily between meals. 09/25/14   Renae Fickle, MD  furosemide (LASIX)  20 MG tablet Take 20 mg by mouth every other day.    Historical Provider, MD  hydrALAZINE (APRESOLINE) 50 MG tablet Take 50 mg by mouth 2 (two) times daily.     Historical Provider, MD  memantine (NAMENDA XR) 28 MG CP24 24 hr capsule Take 28 mg by mouth daily.    Historical Provider, MD  metoprolol (LOPRESSOR) 50 MG tablet Take 50 mg by mouth 2 (two) times daily.    Historical Provider, MD  Multiple Vitamin (MULTIVITAMIN WITH MINERALS) TABS tablet Take 1 tablet  by mouth daily.    Historical Provider, MD  pantoprazole (PROTONIX) 40 MG tablet Take 40 mg by mouth daily.    Historical Provider, MD  polyethylene glycol (MIRALAX / GLYCOLAX) packet Take 17 g by mouth 2 (two) times daily. Patient taking differently: Take 17 g by mouth 3 (three) times daily as needed for mild constipation.  04/18/14   Calvert Cantor, MD  saccharomyces boulardii (FLORASTOR) 250 MG capsule Take 1 capsule (250 mg total) by mouth 2 (two) times daily. 09/25/14   Renae Fickle, MD  simvastatin (ZOCOR) 10 MG tablet Take 10 mg by mouth daily.    Historical Provider, MD  solifenacin (VESICARE) 10 MG tablet Take 10 mg by mouth daily.    Historical Provider, MD   BP 115/67 mmHg  Pulse 84  Temp(Src) 98.2 F (36.8 C) (Oral)  SpO2 94% Physical Exam  Constitutional: She is oriented to person, place, and time. She appears well-developed and well-nourished. No distress.  Hard of hearing  HENT:  Head: Normocephalic.  Eyes: Conjunctivae are normal. Pupils are equal, round, and reactive to light. No scleral icterus.  Neck: Normal range of motion. Neck supple. No thyromegaly present.  Cardiovascular: Normal rate and regular rhythm.  Exam reveals no gallop and no friction rub.   No murmur heard. Pulmonary/Chest: Effort normal and breath sounds normal. No respiratory distress. She has no wheezes. She has no rales.  Abdominal: Soft. Bowel sounds are normal. She exhibits no distension. There is no tenderness. There is no rebound.  Musculoskeletal: Normal range of motion.  TTP over the distal radius and right forearm  Neurological: She is alert and oriented to person, place, and time.  Skin: Skin is warm and dry. No rash noted.  Psychiatric: She has a normal mood and affect. Her behavior is normal.    ED Course  Procedures  DIAGNOSTIC STUDIES: Oxygen Saturation is 94% on RA, low by my interpretation.    COORDINATION OF CARE: 7:09 PM - Discussed plans to order diagnostic imaging. Pt advised  of plan for treatment and pt agrees.  Labs Review Labs Reviewed - No data to display  Imaging Review Dg Wrist Complete Right  12/15/2014   CLINICAL DATA:  Pain after falling today  EXAM: RIGHT HAND - COMPLETE 3+ VIEW; RIGHT WRIST - COMPLETE 3+ VIEW  COMPARISON:  None.  FINDINGS: Three views of the right hand and four views of the right wrist were obtained. No acute fracture is evident. There is no dislocation. There is no radiopaque foreign body. There is chondrocalcinosis about the wrist. No bone lesion or bony destruction is evident. No acute soft tissue abnormalities are evident.  IMPRESSION: Negative for acute fracture.   Electronically Signed   By: Ellery Plunk M.D.   On: 12/15/2014 19:36   Dg Hand Complete Right  12/15/2014   CLINICAL DATA:  Pain after falling today  EXAM: RIGHT HAND - COMPLETE 3+ VIEW; RIGHT WRIST - COMPLETE 3+ VIEW  COMPARISON:  None.  FINDINGS: Three views of the right hand and four views of the right wrist were obtained. No acute fracture is evident. There is no dislocation. There is no radiopaque foreign body. There is chondrocalcinosis about the wrist. No bone lesion or bony destruction is evident. No acute soft tissue abnormalities are evident.  IMPRESSION: Negative for acute fracture.   Electronically Signed   By: Ellery Plunk M.D.   On: 12/15/2014 19:36   I have personally reviewed and evaluated these images and lab results as part of my medical decision-making.   EKG Interpretation None      MDM   Final diagnoses:  Wrist sprain, right, initial encounter    I personally performed the services described in this documentation, which was scribed in my presence. The recorded information has been reviewed and is accurate.   Rolland Porter, MD 12/19/14 [id number]

## 2014-12-15 NOTE — ED Notes (Signed)
Per daughter, pt was in next room and fell near couch landing on wood flooring.  Fall not witnessed but daughter believes pt was trying to sit on couch and "missed."  Pt has been complaining of right hand pain.

## 2014-12-15 NOTE — Discharge Instructions (Signed)
Wear splint until pain free.  May remove for bathing, etc.  Ice for pain or swelling.  Tylenol or Motrin for pain

## 2015-02-11 ENCOUNTER — Emergency Department (HOSPITAL_COMMUNITY): Payer: Medicare Other

## 2015-02-11 ENCOUNTER — Encounter (HOSPITAL_COMMUNITY): Payer: Self-pay | Admitting: *Deleted

## 2015-02-11 ENCOUNTER — Emergency Department (HOSPITAL_COMMUNITY)
Admission: EM | Admit: 2015-02-11 | Discharge: 2015-02-11 | Disposition: A | Discharge status: Home / Self Care | Payer: Medicare Other | Attending: Emergency Medicine | Admitting: Emergency Medicine

## 2015-02-11 DIAGNOSIS — R0602 Shortness of breath: Secondary | ICD-10-CM | POA: Insufficient documentation

## 2015-02-11 DIAGNOSIS — I10 Essential (primary) hypertension: Secondary | ICD-10-CM | POA: Insufficient documentation

## 2015-02-11 DIAGNOSIS — I4891 Unspecified atrial fibrillation: Secondary | ICD-10-CM | POA: Insufficient documentation

## 2015-02-11 DIAGNOSIS — I2510 Atherosclerotic heart disease of native coronary artery without angina pectoris: Secondary | ICD-10-CM | POA: Diagnosis not present

## 2015-02-11 DIAGNOSIS — R509 Fever, unspecified: Secondary | ICD-10-CM | POA: Insufficient documentation

## 2015-02-11 DIAGNOSIS — R05 Cough: Secondary | ICD-10-CM | POA: Diagnosis not present

## 2015-02-11 DIAGNOSIS — F0390 Unspecified dementia without behavioral disturbance: Secondary | ICD-10-CM | POA: Insufficient documentation

## 2015-02-11 DIAGNOSIS — R5383 Other fatigue: Secondary | ICD-10-CM | POA: Insufficient documentation

## 2015-02-11 DIAGNOSIS — Z79899 Other long term (current) drug therapy: Secondary | ICD-10-CM | POA: Diagnosis not present

## 2015-02-11 DIAGNOSIS — I499 Cardiac arrhythmia, unspecified: Secondary | ICD-10-CM | POA: Diagnosis not present

## 2015-02-11 DIAGNOSIS — Z792 Long term (current) use of antibiotics: Secondary | ICD-10-CM | POA: Insufficient documentation

## 2015-02-11 DIAGNOSIS — Z8673 Personal history of transient ischemic attack (TIA), and cerebral infarction without residual deficits: Secondary | ICD-10-CM | POA: Insufficient documentation

## 2015-02-11 DIAGNOSIS — Z8719 Personal history of other diseases of the digestive system: Secondary | ICD-10-CM | POA: Diagnosis not present

## 2015-02-11 DIAGNOSIS — R059 Cough, unspecified: Secondary | ICD-10-CM

## 2015-02-11 LAB — URINALYSIS, ROUTINE W REFLEX MICROSCOPIC
Bilirubin Urine: NEGATIVE
Glucose, UA: NEGATIVE mg/dL
Hgb urine dipstick: NEGATIVE
Ketones, ur: NEGATIVE mg/dL
Leukocytes, UA: NEGATIVE
NITRITE: NEGATIVE
Protein, ur: NEGATIVE mg/dL
SPECIFIC GRAVITY, URINE: 1.015 (ref 1.005–1.030)
pH: 6 (ref 5.0–8.0)

## 2015-02-11 LAB — CBC
HEMATOCRIT: 39 % (ref 36.0–46.0)
Hemoglobin: 12.4 g/dL (ref 12.0–15.0)
MCH: 30.6 pg (ref 26.0–34.0)
MCHC: 31.8 g/dL (ref 30.0–36.0)
MCV: 96.3 fL (ref 78.0–100.0)
Platelets: 215 10*3/uL (ref 150–400)
RBC: 4.05 MIL/uL (ref 3.87–5.11)
RDW: 13.9 % (ref 11.5–15.5)
WBC: 12.2 10*3/uL — AB (ref 4.0–10.5)

## 2015-02-11 LAB — BASIC METABOLIC PANEL
Anion gap: 10 (ref 5–15)
BUN: 32 mg/dL — ABNORMAL HIGH (ref 6–20)
CHLORIDE: 103 mmol/L (ref 101–111)
CO2: 28 mmol/L (ref 22–32)
Calcium: 9.1 mg/dL (ref 8.9–10.3)
Creatinine, Ser: 1.48 mg/dL — ABNORMAL HIGH (ref 0.44–1.00)
GFR calc Af Amer: 36 mL/min — ABNORMAL LOW (ref >60)
GFR calc non Af Amer: 31 mL/min — ABNORMAL LOW (ref >60)
GLUCOSE: 117 mg/dL — AB (ref 65–99)
POTASSIUM: 4.3 mmol/L (ref 3.5–5.1)
Sodium: 141 mmol/L (ref 135–145)

## 2015-02-11 LAB — TROPONIN I: Troponin I: <0.03 ng/mL (ref <0.031)

## 2015-02-11 MED ORDER — SODIUM CHLORIDE 0.9 % IV BOLUS (SEPSIS)
1000.0000 mL | Freq: Once | INTRAVENOUS | Status: AC
  Administered 2015-02-11: 1000 mL via INTRAVENOUS

## 2015-02-11 MED ORDER — ALBUTEROL SULFATE HFA 108 (90 BASE) MCG/ACT IN AERS
2.0000 | INHALATION_SPRAY | RESPIRATORY_TRACT | Status: DC | PRN
  Administered 2015-02-11: 2 via RESPIRATORY_TRACT
  Filled 2015-02-11: qty 6.7

## 2015-02-11 NOTE — ED Notes (Signed)
Patient transported to X-ray 

## 2015-02-11 NOTE — ED Notes (Signed)
Pt presents w/ "deep cough", non-productive x1 week per daughter, pt also has associated chest pain and shortness of breath - denies fever.

## 2015-02-11 NOTE — ED Provider Notes (Signed)
CSN: [id number]     Arrival date & time 02/11/15  0048 History  By signing my name below, I, Evon Slackerrance Branch, attest that this documentation has been prepared under the direction and in the presence of Azalia BilisKevin Damon Baisch, MD. Electronically Signed: Evon Slackerrance Branch, ED Scribe. 02/11/2015. 1:30 AM.    Chief Complaint  Patient presents with  . Chest Pain  . Cough  . Shortness of Breath    The history is provided by a relative. No language interpreter was used.   HPI Comments: Level 5 Caveat: Dementia Carolyn Sanders is a 81 y.o. female with PMHx of A-Fib and Dementia who presents to the Emergency Department complaining of "deep" cough onset 1 week. Daughter reports slight fever 2 days prior. Daughter states that she has been trying mucinex with no relief. Daughter states that even when she is on oxygen she intermittently looks as if she is gasping for air. Daughter reports she seems fatigue and looking "more off balance." Daughter states that her mental status is worse than normal. Daughter also state that she has been eating less than normal. Denies vomiting or diarrhea. Daughter states that she has a Hx of UTI's   Past Medical History  Diagnosis Date  . Stroke [facility](HCC)     residual left hemineglect  . Coronary artery disease   . Atrial fibrillation (HCC)   . Hypertension   . Dementia   . Hiatal hernia    Past Surgical History  Procedure Laterality Date  . Carotid endarterectomy     Family History  Problem Relation Age of Onset  . Breast cancer Mother    Social History  Substance Use Topics  . Smoking status: Never Smoker   . Smokeless tobacco: Never Used  . Alcohol Use: No   OB History    No data available      Review of Systems  Unable to perform ROS: Dementia  Respiratory: Positive for cough.      Allergies  Review of patient's allergies indicates no known allergies.  Home Medications   Prior to Admission medications   Medication Sig Start Date End Date Taking?  Authorizing Provider  acetaminophen (TYLENOL) 325 MG tablet Take 2 tablets (650 mg total) by mouth every 6 (six) hours as needed for mild pain (or Fever >/= 101). 05/11/14   Jeralyn BennettEzequiel Zamora, MD  amoxicillin-clavulanate (AUGMENTIN) 250-62.5 MG/5ML suspension Take 10 mLs (500 mg total) by mouth 2 (two) times daily. 09/25/14   Renae FickleMackenzie Short, MD  cephALEXin (KEFLEX) 250 MG capsule Take 1 capsule (250 mg total) by mouth 4 (four) times daily. 11/03/14   Courteney Lyn Mackuen, MD  dabigatran (PRADAXA) 75 MG CAPS Take 75 mg by mouth every 12 (twelve) hours.      Historical Provider, MD  diltiazem (DILACOR XR) 180 MG 24 hr capsule Take 180 mg by mouth daily.    Historical Provider, MD  docusate sodium (COLACE) 100 MG capsule Take 200 mg by mouth daily.     Historical Provider, MD  feeding supplement, ENSURE ENLIVE, (ENSURE ENLIVE) LIQD Take 237 mLs by mouth 2 (two) times daily between meals. 09/25/14   Renae FickleMackenzie Short, MD  furosemide (LASIX) 20 MG tablet Take 20 mg by mouth every other day.    Historical Provider, MD  hydrALAZINE (APRESOLINE) 50 MG tablet Take 50 mg by mouth 2 (two) times daily.     Historical Provider, MD  memantine (NAMENDA XR) 28 MG CP24 24 hr capsule Take 28 mg by mouth daily.  Historical Provider, MD  metoprolol (LOPRESSOR) 50 MG tablet Take 50 mg by mouth 2 (two) times daily.    Historical Provider, MD  Multiple Vitamin (MULTIVITAMIN WITH MINERALS) TABS tablet Take 1 tablet by mouth daily.    Historical Provider, MD  pantoprazole (PROTONIX) 40 MG tablet Take 40 mg by mouth daily.    Historical Provider, MD  polyethylene glycol (MIRALAX / GLYCOLAX) packet Take 17 g by mouth 2 (two) times daily. Patient taking differently: Take 17 g by mouth 3 (three) times daily as needed for mild constipation.  04/18/14   Calvert Cantor, MD  saccharomyces boulardii (FLORASTOR) 250 MG capsule Take 1 capsule (250 mg total) by mouth 2 (two) times daily. 09/25/14   Renae Fickle, MD  simvastatin (ZOCOR) 10 MG  tablet Take 10 mg by mouth daily.    Historical Provider, MD  solifenacin (VESICARE) 10 MG tablet Take 10 mg by mouth daily.    Historical Provider, MD   BP 139/44 mmHg  Pulse 78  Temp(Src) 98.2 F (36.8 C) (Oral)  Resp 18  SpO2 91%   Physical Exam  Constitutional: She is oriented to person, place, and time. She appears well-developed and well-nourished. No distress.  HENT:  Head: Normocephalic and atraumatic.  Eyes: EOM are normal.  Neck: Normal range of motion.  Cardiovascular: Normal rate and normal heart sounds.  An irregularly irregular rhythm present.  Pulmonary/Chest: Effort normal and breath sounds normal.  Abdominal: Soft. She exhibits no distension. There is no tenderness.  Musculoskeletal: Normal range of motion.  Neurological: She is alert and oriented to person, place, and time.  Skin: Skin is warm and dry.  Psychiatric: She has a normal mood and affect. Judgment normal.  Nursing note and vitals reviewed.   ED Course  Procedures (including critical care time) DIAGNOSTIC STUDIES: Oxygen Saturation is 91% on RA, low by my interpretation.    COORDINATION OF CARE:    Labs Review Labs Reviewed  BASIC METABOLIC PANEL - Abnormal; Notable for the following:    Glucose, Bld 117 (*)    BUN 32 (*)    Creatinine, Ser 1.48 (*)    GFR calc non Af Amer 31 (*)    GFR calc Af Amer 36 (*)    All other components within normal limits  CBC - Abnormal; Notable for the following:    WBC 12.2 (*)    All other components within normal limits  TROPONIN I  URINALYSIS, ROUTINE W REFLEX MICROSCOPIC (NOT AT [facility])    Imaging Review Dg Chest 2 View  02/11/2015  CLINICAL DATA:  81 year old female with chest pain cough and shortness of breath EXAM: CHEST  2 VIEW COMPARISON:  Radiograph dated 09/23/2013 FINDINGS: Two views of the chest demonstrate emphysematous changes of the lungs. There is no focal consolidation, pleural effusion, or pneumothorax. Stable cardiomegaly.  Atherosclerotic calcification of the aorta. A hiatal hernia is noted. The osseous structures are grossly unremarkable. Right axillary surgical clips noted. IMPRESSION: No active cardiopulmonary disease. Electronically Signed   By: Elgie Collard M.D.   On: 02/11/2015 02:09   I have personally reviewed and evaluated these images and lab results as part of my medical decision-making.   EKG Interpretation   Date/Time:  Monday February 11 2015 01:12:10 EST Ventricular Rate:  75 PR Interval:    QRS Duration: 94 QT Interval:  428 QTC Calculation: 478 R Axis:   71 Text Interpretation:  Atrial fibrillation No significant change was found  Confirmed by Rayford Williamsen  MD, Caryn Bee (  [id number]) on 02/11/2015 1:29:02 AM      MDM   Final diagnoses:  Cough   Patient is overall well-appearing.  Discharge home in good condition.  Labs without significant abnormality.  Baseline renal insufficiency for the patient.  Chest x-ray without signs of pneumonia.  Discharge home with primary care follow-up.  Albuterol MDI given for cough.  Doubt ACS.  Doubt PE   I personally performed the services described in this documentation, which was scribed in my presence. The recorded information has been reviewed and is accurate.         Azalia Bilis, MD 02/11/15 [phone]

## 2015-03-08 ENCOUNTER — Encounter (HOSPITAL_COMMUNITY): Payer: Self-pay | Admitting: Emergency Medicine

## 2015-03-08 ENCOUNTER — Emergency Department (HOSPITAL_COMMUNITY): Payer: Medicare Other

## 2015-03-08 ENCOUNTER — Inpatient Hospital Stay (HOSPITAL_COMMUNITY)
Admission: EM | Admit: 2015-03-08 | Discharge: 2015-03-10 | DRG: 194 | Disposition: A | Discharge status: Home / Self Care | Payer: Medicare Other | Attending: Internal Medicine | Admitting: Internal Medicine

## 2015-03-08 DIAGNOSIS — Z8673 Personal history of transient ischemic attack (TIA), and cerebral infarction without residual deficits: Secondary | ICD-10-CM | POA: Diagnosis not present

## 2015-03-08 DIAGNOSIS — N179 Acute kidney failure, unspecified: Secondary | ICD-10-CM | POA: Diagnosis present

## 2015-03-08 DIAGNOSIS — F0390 Unspecified dementia without behavioral disturbance: Secondary | ICD-10-CM | POA: Diagnosis present

## 2015-03-08 DIAGNOSIS — I129 Hypertensive chronic kidney disease with stage 1 through stage 4 chronic kidney disease, or unspecified chronic kidney disease: Secondary | ICD-10-CM | POA: Diagnosis present

## 2015-03-08 DIAGNOSIS — R630 Anorexia: Secondary | ICD-10-CM | POA: Diagnosis present

## 2015-03-08 DIAGNOSIS — I482 Chronic atrial fibrillation, unspecified: Secondary | ICD-10-CM | POA: Diagnosis present

## 2015-03-08 DIAGNOSIS — R410 Disorientation, unspecified: Secondary | ICD-10-CM | POA: Diagnosis not present

## 2015-03-08 DIAGNOSIS — E1122 Type 2 diabetes mellitus with diabetic chronic kidney disease: Secondary | ICD-10-CM | POA: Diagnosis present

## 2015-03-08 DIAGNOSIS — N183 Chronic kidney disease, stage 3 unspecified: Secondary | ICD-10-CM | POA: Diagnosis present

## 2015-03-08 DIAGNOSIS — R0902 Hypoxemia: Secondary | ICD-10-CM | POA: Diagnosis present

## 2015-03-08 DIAGNOSIS — R627 Adult failure to thrive: Secondary | ICD-10-CM | POA: Diagnosis present

## 2015-03-08 DIAGNOSIS — I2510 Atherosclerotic heart disease of native coronary artery without angina pectoris: Secondary | ICD-10-CM | POA: Diagnosis present

## 2015-03-08 DIAGNOSIS — I10 Essential (primary) hypertension: Secondary | ICD-10-CM | POA: Diagnosis present

## 2015-03-08 DIAGNOSIS — K449 Diaphragmatic hernia without obstruction or gangrene: Secondary | ICD-10-CM | POA: Diagnosis present

## 2015-03-08 DIAGNOSIS — J189 Pneumonia, unspecified organism: Secondary | ICD-10-CM | POA: Diagnosis present

## 2015-03-08 DIAGNOSIS — Z79899 Other long term (current) drug therapy: Secondary | ICD-10-CM

## 2015-03-08 DIAGNOSIS — E119 Type 2 diabetes mellitus without complications: Secondary | ICD-10-CM

## 2015-03-08 DIAGNOSIS — R531 Weakness: Secondary | ICD-10-CM

## 2015-03-08 LAB — CBC
HCT: 38 % (ref 36.0–46.0)
Hemoglobin: 12.1 g/dL (ref 12.0–15.0)
MCH: 30.1 pg (ref 26.0–34.0)
MCHC: 31.8 g/dL (ref 30.0–36.0)
MCV: 94.5 fL (ref 78.0–100.0)
PLATELETS: 241 10*3/uL (ref 150–400)
RBC: 4.02 MIL/uL (ref 3.87–5.11)
RDW: 13.7 % (ref 11.5–15.5)
WBC: 11 10*3/uL — AB (ref 4.0–10.5)

## 2015-03-08 LAB — URINALYSIS, ROUTINE W REFLEX MICROSCOPIC
Bilirubin Urine: NEGATIVE
GLUCOSE, UA: NEGATIVE mg/dL
Hgb urine dipstick: NEGATIVE
KETONES UR: NEGATIVE mg/dL
LEUKOCYTES UA: NEGATIVE
NITRITE: NEGATIVE
PROTEIN: NEGATIVE mg/dL
Specific Gravity, Urine: 1.015 (ref 1.005–1.030)
pH: 6 (ref 5.0–8.0)

## 2015-03-08 LAB — COMPREHENSIVE METABOLIC PANEL
ALT: 22 U/L (ref 14–54)
AST: 34 U/L (ref 15–41)
Albumin: 4.1 g/dL (ref 3.5–5.0)
Alkaline Phosphatase: 110 U/L (ref 38–126)
Anion gap: 13 (ref 5–15)
BILIRUBIN TOTAL: 1 mg/dL (ref 0.3–1.2)
BUN: 26 mg/dL — AB (ref 6–20)
CHLORIDE: 105 mmol/L (ref 101–111)
CO2: 26 mmol/L (ref 22–32)
CREATININE: 1.31 mg/dL — AB (ref 0.44–1.00)
Calcium: 9.1 mg/dL (ref 8.9–10.3)
GFR, EST AFRICAN AMERICAN: 41 mL/min — AB (ref >60)
GFR, EST NON AFRICAN AMERICAN: 35 mL/min — AB (ref >60)
Glucose, Bld: 167 mg/dL — ABNORMAL HIGH (ref 65–99)
Potassium: 3.7 mmol/L (ref 3.5–5.1)
SODIUM: 144 mmol/L (ref 135–145)
TOTAL PROTEIN: 7.1 g/dL (ref 6.5–8.1)

## 2015-03-08 LAB — I-STAT CG4 LACTIC ACID, ED: LACTIC ACID, VENOUS: 1.86 mmol/L (ref 0.5–2.0)

## 2015-03-08 LAB — LIPASE, BLOOD: LIPASE: 25 U/L (ref 11–51)

## 2015-03-08 MED ORDER — LEVOFLOXACIN IN D5W 500 MG/100ML IV SOLN: 500.0000 mg | INTRAVENOUS | Status: DC

## 2015-03-08 MED ORDER — SODIUM CHLORIDE 0.9 % IV BOLUS (SEPSIS)
1000.0000 mL | Freq: Once | INTRAVENOUS | Status: AC
  Administered 2015-03-08: 1000 mL via INTRAVENOUS

## 2015-03-08 MED ORDER — LEVOFLOXACIN IN D5W 500 MG/100ML IV SOLN
500.0000 mg | Freq: Once | INTRAVENOUS | Status: AC
  Administered 2015-03-08: 500 mg via INTRAVENOUS
  Filled 2015-03-08: qty 100

## 2015-03-08 MED ORDER — ONDANSETRON HCL 4 MG/2ML IJ SOLN
4.0000 mg | Freq: Once | INTRAMUSCULAR | Status: AC
  Administered 2015-03-08: 4 mg via INTRAVENOUS
  Filled 2015-03-08: qty 2

## 2015-03-08 NOTE — ED Notes (Signed)
Pt sleeping when I entered the room for IV placement. Difficult to arouse, but was able to arouse her with painful stimuli. MD made aware. No distress noted.

## 2015-03-08 NOTE — ED Provider Notes (Signed)
CSN: [id number]     Arrival date & time 03/08/15  1819 History   First MD Initiated Contact with Patient 03/08/15 2032     Chief Complaint  Patient presents with  . Emesis  . Not Eating      (Consider location/radiation/quality/duration/timing/severity/associated sxs/prior Treatment) The history is provided by the patient and a relative.  Carolyn Sanders is a 81 y.o. female hx of stroke, afib on pradaxa, dementia, here with failure to thrive, fever. Patient has not been eating and drinking much for the last several days. Patient has been refusing to eat and had several episodes of vomiting today. Patient has been having several episodes of coughing as well. Moreover, patient had fever 102 at home as per the daughter and she gave her some Tylenol but she did vomit that up today. Patient was seen earlier this month and was diagnosed with bronchitis.   Level V caveat- dementia   Past Medical History  Diagnosis Date  . Stroke [facility](HCC)     residual left hemineglect  . Coronary artery disease   . Atrial fibrillation (HCC)   . Hypertension   . Dementia   . Hiatal hernia    Past Surgical History  Procedure Laterality Date  . Carotid endarterectomy     Family History  Problem Relation Age of Onset  . Breast cancer Mother    Social History  Substance Use Topics  . Smoking status: Never Smoker   . Smokeless tobacco: Never Used  . Alcohol Use: No   OB History    No data available     Review of Systems  Respiratory: Positive for cough.   Gastrointestinal: Positive for vomiting.  Neurological: Positive for weakness.  All other systems reviewed and are negative.     Allergies  Review of patient's allergies indicates no known allergies.  Home Medications   Prior to Admission medications   Medication Sig Start Date End Date Taking? Authorizing Provider  acetaminophen (TYLENOL) 325 MG tablet Take 2 tablets (650 mg total) by mouth every 6 (six) hours as needed for mild pain  (or Fever >/= 101). 05/11/14  Yes Jeralyn BennettEzequiel Zamora, MD  dabigatran (PRADAXA) 75 MG CAPS Take 75 mg by mouth every 12 (twelve) hours.     Yes Historical Provider, MD  diltiazem (DILACOR XR) 180 MG 24 hr capsule Take 180 mg by mouth daily.   Yes Historical Provider, MD  docusate sodium (COLACE) 100 MG capsule Take 200 mg by mouth daily.    Yes Historical Provider, MD  furosemide (LASIX) 20 MG tablet Take 20 mg by mouth every other day.   Yes Historical Provider, MD  hydrALAZINE (APRESOLINE) 50 MG tablet Take 50 mg by mouth 2 (two) times daily.    Yes Historical Provider, MD  memantine (NAMENDA XR) 28 MG CP24 24 hr capsule Take 28 mg by mouth daily.   Yes Historical Provider, MD  metoprolol (LOPRESSOR) 50 MG tablet Take 50 mg by mouth 2 (two) times daily.   Yes Historical Provider, MD  Multiple Vitamin (MULTIVITAMIN WITH MINERALS) TABS tablet Take 1 tablet by mouth daily.   Yes Historical Provider, MD  pantoprazole (PROTONIX) 40 MG tablet Take 40 mg by mouth daily.   Yes Historical Provider, MD  polyethylene glycol (MIRALAX / GLYCOLAX) packet Take 17 g by mouth 2 (two) times daily. 04/18/14  Yes Calvert CantorSaima Rizwan, MD  saccharomyces boulardii (FLORASTOR) 250 MG capsule Take 1 capsule (250 mg total) by mouth 2 (two) times daily. 09/25/14  Yes  Renae Fickle, MD  simvastatin (ZOCOR) 10 MG tablet Take 10 mg by mouth daily.   Yes Historical Provider, MD  solifenacin (VESICARE) 10 MG tablet Take 10 mg by mouth daily.   Yes Historical Provider, MD  amoxicillin-clavulanate (AUGMENTIN) 250-62.5 MG/5ML suspension Take 10 mLs (500 mg total) by mouth 2 (two) times daily. Patient not taking: Reported on 03/08/2015 09/25/14   Renae Fickle, MD  cephALEXin (KEFLEX) 250 MG capsule Take 1 capsule (250 mg total) by mouth 4 (four) times daily. Patient not taking: Reported on 03/08/2015 11/03/14   Courteney Lyn Mackuen, MD  feeding supplement, ENSURE ENLIVE, (ENSURE ENLIVE) LIQD Take 237 mLs by mouth 2 (two) times daily between  meals. Patient not taking: Reported on 03/08/2015 09/25/14   Renae Fickle, MD   BP 115/70 mmHg  Pulse 112  Temp(Src) 98.1 F (36.7 C) (Rectal)  Resp 18  SpO2 90% Physical Exam  Constitutional:  Chronically ill   HENT:  Head: Normocephalic.  MM dry   Eyes: Conjunctivae are normal. Pupils are equal, round, and reactive to light.  Neck: Normal range of motion. Neck supple.  Cardiovascular: Regular rhythm and normal heart sounds.   Tachycardic   Pulmonary/Chest: Effort normal.  Diminished bilateral bases   Abdominal: Soft. Bowel sounds are normal. She exhibits no distension. There is no tenderness. There is no rebound.  Musculoskeletal: Normal range of motion. She exhibits no edema or tenderness.  Neurological:  Tired, confused, arousable. Moving all extremities   Skin: Skin is warm and dry.  Psychiatric: She has a normal mood and affect. Her behavior is normal. Judgment and thought content normal.  Nursing note and vitals reviewed.   ED Course  Procedures (including critical care time) Labs Review Labs Reviewed  COMPREHENSIVE METABOLIC PANEL - Abnormal; Notable for the following:    Glucose, Bld 167 (*)    BUN 26 (*)    Creatinine, Ser 1.31 (*)    GFR calc non Af Amer 35 (*)    GFR calc Af Amer 41 (*)    All other components within normal limits  CBC - Abnormal; Notable for the following:    WBC 11.0 (*)    All other components within normal limits  CULTURE, BLOOD (ROUTINE X 2)  CULTURE, BLOOD (ROUTINE X 2)  LIPASE, BLOOD  URINALYSIS, ROUTINE W REFLEX MICROSCOPIC (NOT AT [facility])  I-STAT CG4 LACTIC ACID, ED    Imaging Review Dg Chest 2 View  03/08/2015  CLINICAL DATA:  Initial encounter for mild cough in 1 year drank over the last couple of days. EXAM: CHEST  2 VIEW COMPARISON:  02/15/2015. FINDINGS: The cardio pericardial silhouette is enlarged. There is pulmonary vascular congestion without overt pulmonary edema. Interstitial markings are diffusely coarsened with  chronic features. Left base opacity is stable. Moderate hiatal hernia noted. Bones are diffusely demineralized. IMPRESSION: Stable exam. Cardiomegaly with vascular congestion and underlying chronic interstitial changes. Left base chronic atelectasis or scarring. Moderate to large hiatal hernia. Electronically Signed   By: Kennith Center M.D.   On: 03/08/2015 21:15   I have personally reviewed and evaluated these images and lab results as part of my medical decision-making.   EKG Interpretation None      MDM   Final diagnoses:  None   Carolyn Sanders is a 81 y.o. female here with cough, failure to thrive, fever. Patient tachycardic, pulse Ox 90% on RA. I am concerned for aspiration pneumonia. Will do sepsis workup, CXR, UA. Will hydrate and likely admit.  10:34 PM  WBC 11. CXR showed L atelectasis. Lactate nl. O2 still 90%. Patient confused. Given levaquin empirically. Will admit.     Richardean Canal, MD 03/08/15 [phone]

## 2015-03-08 NOTE — H&P (Signed)
[facility] Admission History and Physical       Carolyn Sanders ZOX:[id number] DOB: 12/31/1925 DOA: 03/08/2015  Referring physician: EDP PCP: Zoila Shutter, MD  Specialists:   Chief Complaint: Increased Confusion  HPI: Carolyn Sanders is a 81 y.o. female with a history of CAD, HTN, Dementia, previous CVA who was brought to ED due to increased Confusion and Weakness and poor PO intake for the past 2-3 days.  Her daughter reports that she had a fever to 102 at home and had been coughing and was given tylenol for her Fever prior to arrival.   Patient had an episode of vomiting x1 today.   In the ED she was evaluated  Found to have an elevated BUN/Cr , and though her Chest X-ray was negative for acute findings she was placed on empiric IV Levaquin for CAP and referred for admission.   The history was given by the Daughter who is at the bedside.      Review of Systems: Unable to Obtain from the Patient  Past Medical History  Diagnosis Date  . Stroke [facility])     residual left hemineglect  . Coronary artery disease   . Atrial fibrillation (HCC)   . Hypertension   . Dementia   . Hiatal hernia      Past Surgical History  Procedure Laterality Date  . Carotid endarterectomy        Prior to Admission medications   Medication Sig Start Date End Date Taking? Authorizing Provider  acetaminophen (TYLENOL) 325 MG tablet Take 2 tablets (650 mg total) by mouth every 6 (six) hours as needed for mild pain (or Fever >/= 101). 05/11/14  Yes Jeralyn Bennett, MD  dabigatran (PRADAXA) 75 MG CAPS Take 75 mg by mouth every 12 (twelve) hours.     Yes Historical Provider, MD  diltiazem (DILACOR XR) 180 MG 24 hr capsule Take 180 mg by mouth daily.   Yes Historical Provider, MD  docusate sodium (COLACE) 100 MG capsule Take 200 mg by mouth daily.    Yes Historical Provider, MD  furosemide (LASIX) 20 MG tablet Take 20 mg by mouth every other day.   Yes Historical Provider, MD  hydrALAZINE  (APRESOLINE) 50 MG tablet Take 50 mg by mouth 2 (two) times daily.    Yes Historical Provider, MD  memantine (NAMENDA XR) 28 MG CP24 24 hr capsule Take 28 mg by mouth daily.   Yes Historical Provider, MD  metoprolol (LOPRESSOR) 50 MG tablet Take 50 mg by mouth 2 (two) times daily.   Yes Historical Provider, MD  Multiple Vitamin (MULTIVITAMIN WITH MINERALS) TABS tablet Take 1 tablet by mouth daily.   Yes Historical Provider, MD  pantoprazole (PROTONIX) 40 MG tablet Take 40 mg by mouth daily.   Yes Historical Provider, MD  polyethylene glycol (MIRALAX / GLYCOLAX) packet Take 17 g by mouth 2 (two) times daily. 04/18/14  Yes Calvert Cantor, MD  saccharomyces boulardii (FLORASTOR) 250 MG capsule Take 1 capsule (250 mg total) by mouth 2 (two) times daily. 09/25/14  Yes Renae Fickle, MD  simvastatin (ZOCOR) 10 MG tablet Take 10 mg by mouth daily.   Yes Historical Provider, MD  solifenacin (VESICARE) 10 MG tablet Take 10 mg by mouth daily.   Yes Historical Provider, MD  amoxicillin-clavulanate (AUGMENTIN) 250-62.5 MG/5ML suspension Take 10 mLs (500 mg total) by mouth 2 (two) times daily. Patient not taking: Reported on 03/08/2015 09/25/14   Renae Fickle, MD  cephALEXin (KEFLEX) 250 MG capsule Take  1 capsule (250 mg total) by mouth 4 (four) times daily. Patient not taking: Reported on 03/08/2015 11/03/14   Courteney Lyn Mackuen, MD  feeding supplement, ENSURE ENLIVE, (ENSURE ENLIVE) LIQD Take 237 mLs by mouth 2 (two) times daily between meals. Patient not taking: Reported on 03/08/2015 09/25/14   Renae Fickle, MD     No Known Allergies  Social History:  Lives with Her Daughter, Carolyn Sanders, Feeds herself, but Unable to Dress and Bathe Herself alone   reports that she has never smoked. She has never used smokeless tobacco. She reports that she does not drink alcohol or use illicit drugs.    Family History  Problem Relation Age of Onset  . Breast cancer Mother        Physical Exam:  GEN:   Pleasant Elderly   81 y.o. female examined and in no acute distress; cooperative with exam Filed Vitals:   03/08/15 1828 03/08/15 2030 03/08/15 2103  BP: 141/90 115/70   Pulse: 70 112   Temp: 98.7 F (37.1 C)  98.1 F (36.7 C)  TempSrc: Oral  Rectal  Resp: 15 18   SpO2: 93% 90%    Blood pressure 115/70, pulse 112, temperature 98.1 F (36.7 C), temperature source Rectal, resp. rate 18, SpO2 90 %. PSYCH: She is alert and oriented x 0; does not appear anxious does not appear depressed; affect is normal HEENT: Normocephalic and Atraumatic, Mucous membranes pink; PERRLA; EOM intact; Fundi:  Benign;  No scleral icterus, Nares: Patent, Oropharynx: Clear,     Neck:  FROM, No Cervical Lymphadenopathy nor Thyromegaly or Carotid Bruit; No JVD; Breasts:: Not examined CHEST WALL: No tenderness CHEST: Normal respiration, clear to auscultation bilaterally HEART: Regular rate and rhythm; no murmurs rubs or gallops BACK: No kyphosis or scoliosis; No CVA tenderness ABDOMEN: Positive Bowel Sounds, Soft Non-Tender, No Rebound or Guarding; No Masses, No Organomegaly. Rectal Exam: Not done EXTREMITIES: No Cyanosis, Clubbing, or Edema; No Ulcerations. Genitalia: not examined PULSES: 2+ and symmetric SKIN: Normal hydration no rash or ulceration CNS:  Alert and Oriented x 0, No Focal Deficits Vascular: pulses palpable throughout    Labs on Admission:  Basic Metabolic Panel:  Recent Labs Lab 03/08/15 1857  NA 144  K 3.7  CL 105  CO2 26  GLUCOSE 167*  BUN 26*  CREATININE 1.31*  CALCIUM 9.1   Liver Function Tests:  Recent Labs Lab 03/08/15 1857  AST 34  ALT 22  ALKPHOS 110  BILITOT 1.0  PROT 7.1  ALBUMIN 4.1    Recent Labs Lab 03/08/15 1857  LIPASE 25   No results for input(s): AMMONIA in the last 168 hours. CBC:  Recent Labs Lab 03/08/15 1857  WBC 11.0*  HGB 12.1  HCT 38.0  MCV 94.5  PLT 241   Cardiac Enzymes: No results for input(s): CKTOTAL, CKMB, CKMBINDEX,  TROPONINI in the last 168 hours.  BNP (last 3 results)  Recent Labs  05/08/14 2253 09/03/14 0403 09/22/14 1830  BNP 457.4* 280.6* 160.5*    ProBNP (last 3 results) No results for input(s): PROBNP in the last 8760 hours.  CBG: No results for input(s): GLUCAP in the last 168 hours.  Radiological Exams on Admission: Dg Chest 2 View  03/08/2015  CLINICAL DATA:  Initial encounter for mild cough in 1 year drank over the last couple of days. EXAM: CHEST  2 VIEW COMPARISON:  02/15/2015. FINDINGS: The cardio pericardial silhouette is enlarged. There is pulmonary vascular congestion without overt pulmonary edema. Interstitial markings  are diffusely coarsened with chronic features. Left base opacity is stable. Moderate hiatal hernia noted. Bones are diffusely demineralized. IMPRESSION: Stable exam. Cardiomegaly with vascular congestion and underlying chronic interstitial changes. Left base chronic atelectasis or scarring. Moderate to large hiatal hernia. Electronically Signed   By: Kennith CenterEric  Mansell M.D.   On: 03/08/2015 21:15            Assessment/Plan:        81 y.o. female with  Active Problems:   1.      CAP (community acquired pneumonia)   IV Levaquin   Monitor O2 sats   O2 PRN     2.      Weakness- due to acute Illness   Monitor     3.      Hypoxia   NCO2 PRN   Monitor O2 sats     4.      Anorexia- due to Acute Illness vs Progression of Dementia   Monitor Intake     5.      AKI (acute kidney injury) (HCC)/Chronic renal disease, stage 3, moderately decreased glomerular filtration rate (GFR) between 30-59 mL/min/1.73 square meter   IVFs   Monitor BUN/Cr     6.      Chronic atrial fibrillation (HCC)   Continue Pradaxa and Diltiazem and Metoprolol Rx     7.      Hypertension   On Metoprolol, and Diltiazem Rx   Monitor BPs     8.      DM type 2 (diabetes mellitus, type 2) (HCC)   Monitor Glyucose levels   SSI Coverage PRN   Check HbA1C in AM     9.       Dementia   Continue Namenda    10.     DVT Prophylaxis   On Pradaxa Rx     Code Status:     FULL CODE      Family Communication:   Daughter (POA) at Bedside   Disposition Plan:    Inpatient  Status        Time spent:  5470 Minutes      Ron ParkerJENKINS,Simrat Kendrick C [facility] Pager [phone]   If 7AM -7PM Please Contact the Day Rounding Team MD for [facility]  If 7PM-7AM, Please Contact Night-Floor Coverage  [email or web address] Password [email or web address] 03/08/2015, 10:50 PM     ADDENDUM:   Patient was seen and examined on 03/08/2015

## 2015-03-08 NOTE — ED Notes (Addendum)
Daughter states she won't eat or drink over the last couple days with a fever that is managed with tylenol. Did vomit today. No diarrhea, no pain, does have a hx of alzheimer's disease. Appears pale in triage. Daughter also worried about a mild cough she's noticed recently.

## 2015-03-09 DIAGNOSIS — E118 Type 2 diabetes mellitus with unspecified complications: Secondary | ICD-10-CM

## 2015-03-09 DIAGNOSIS — J189 Pneumonia, unspecified organism: Principal | ICD-10-CM

## 2015-03-09 DIAGNOSIS — N179 Acute kidney failure, unspecified: Secondary | ICD-10-CM

## 2015-03-09 DIAGNOSIS — I10 Essential (primary) hypertension: Secondary | ICD-10-CM

## 2015-03-09 DIAGNOSIS — R531 Weakness: Secondary | ICD-10-CM

## 2015-03-09 DIAGNOSIS — F0390 Unspecified dementia without behavioral disturbance: Secondary | ICD-10-CM

## 2015-03-09 DIAGNOSIS — R630 Anorexia: Secondary | ICD-10-CM

## 2015-03-09 DIAGNOSIS — I482 Chronic atrial fibrillation: Secondary | ICD-10-CM

## 2015-03-09 DIAGNOSIS — N183 Chronic kidney disease, stage 3 (moderate): Secondary | ICD-10-CM

## 2015-03-09 DIAGNOSIS — R0902 Hypoxemia: Secondary | ICD-10-CM

## 2015-03-09 LAB — STREP PNEUMONIAE URINARY ANTIGEN: STREP PNEUMO URINARY ANTIGEN: NEGATIVE

## 2015-03-09 MED ORDER — ENSURE ENLIVE PO LIQD
237.0000 mL | Freq: Two times a day (BID) | ORAL | Status: DC
  Administered 2015-03-09 – 2015-03-10 (×3): 237 mL via ORAL

## 2015-03-09 MED ORDER — LEVOFLOXACIN IN D5W 250 MG/50ML IV SOLN
250.0000 mg | INTRAVENOUS | Status: DC
  Administered 2015-03-09: 250 mg via INTRAVENOUS
  Filled 2015-03-09: qty 50

## 2015-03-09 MED ORDER — SIMVASTATIN 10 MG PO TABS
10.0000 mg | ORAL_TABLET | Freq: Every day | ORAL | Status: DC
  Administered 2015-03-09 – 2015-03-10 (×2): 10 mg via ORAL
  Filled 2015-03-09 (×2): qty 1

## 2015-03-09 MED ORDER — MEMANTINE HCL ER 28 MG PO CP24
28.0000 mg | ORAL_CAPSULE | Freq: Every day | ORAL | Status: DC
  Administered 2015-03-09 – 2015-03-10 (×2): 28 mg via ORAL
  Filled 2015-03-09 (×2): qty 1

## 2015-03-09 MED ORDER — SODIUM CHLORIDE 0.9 % IV SOLN
INTRAVENOUS | Status: AC
  Administered 2015-03-09: 01:00:00 via INTRAVENOUS

## 2015-03-09 MED ORDER — DOCUSATE SODIUM 100 MG PO CAPS
200.0000 mg | ORAL_CAPSULE | Freq: Every day | ORAL | Status: DC
  Administered 2015-03-09 – 2015-03-10 (×2): 200 mg via ORAL
  Filled 2015-03-09 (×2): qty 2

## 2015-03-09 MED ORDER — PANTOPRAZOLE SODIUM 40 MG PO TBEC
40.0000 mg | DELAYED_RELEASE_TABLET | Freq: Every day | ORAL | Status: DC
  Administered 2015-03-09 – 2015-03-10 (×2): 40 mg via ORAL
  Filled 2015-03-09 (×2): qty 1

## 2015-03-09 MED ORDER — CETYLPYRIDINIUM CHLORIDE 0.05 % MT LIQD
7.0000 mL | Freq: Two times a day (BID) | OROMUCOSAL | Status: DC
  Administered 2015-03-09 – 2015-03-10 (×3): 7 mL via OROMUCOSAL

## 2015-03-09 MED ORDER — DARIFENACIN HYDROBROMIDE ER 15 MG PO TB24
15.0000 mg | ORAL_TABLET | Freq: Every day | ORAL | Status: DC
  Administered 2015-03-09 – 2015-03-10 (×2): 15 mg via ORAL
  Filled 2015-03-09 (×2): qty 1

## 2015-03-09 MED ORDER — HYDRALAZINE HCL 50 MG PO TABS
50.0000 mg | ORAL_TABLET | Freq: Two times a day (BID) | ORAL | Status: DC
  Administered 2015-03-09 – 2015-03-10 (×3): 50 mg via ORAL
  Filled 2015-03-09 (×3): qty 1

## 2015-03-09 MED ORDER — POLYETHYLENE GLYCOL 3350 17 G PO PACK
17.0000 g | PACK | Freq: Two times a day (BID) | ORAL | Status: DC
  Administered 2015-03-09 – 2015-03-10 (×3): 17 g via ORAL
  Filled 2015-03-09 (×3): qty 1

## 2015-03-09 MED ORDER — ADULT MULTIVITAMIN W/MINERALS CH
1.0000 | ORAL_TABLET | Freq: Every day | ORAL | Status: DC
  Administered 2015-03-09 – 2015-03-10 (×2): 1 via ORAL
  Filled 2015-03-09 (×2): qty 1

## 2015-03-09 MED ORDER — METOPROLOL TARTRATE 50 MG PO TABS
50.0000 mg | ORAL_TABLET | Freq: Two times a day (BID) | ORAL | Status: DC
  Administered 2015-03-09 – 2015-03-10 (×3): 50 mg via ORAL
  Filled 2015-03-09 (×3): qty 1

## 2015-03-09 MED ORDER — DABIGATRAN ETEXILATE MESYLATE 75 MG PO CAPS
75.0000 mg | ORAL_CAPSULE | Freq: Two times a day (BID) | ORAL | Status: DC
  Administered 2015-03-09 – 2015-03-10 (×3): 75 mg via ORAL
  Filled 2015-03-09 (×5): qty 1

## 2015-03-09 MED ORDER — DILTIAZEM HCL ER 180 MG PO CP24
180.0000 mg | ORAL_CAPSULE | Freq: Every day | ORAL | Status: DC
  Administered 2015-03-09 – 2015-03-10 (×2): 180 mg via ORAL
  Filled 2015-03-09 (×4): qty 1

## 2015-03-09 MED ORDER — SACCHAROMYCES BOULARDII 250 MG PO CAPS
250.0000 mg | ORAL_CAPSULE | Freq: Two times a day (BID) | ORAL | Status: DC
  Administered 2015-03-09 – 2015-03-10 (×3): 250 mg via ORAL
  Filled 2015-03-09 (×3): qty 1

## 2015-03-09 MED ORDER — FUROSEMIDE 20 MG PO TABS
20.0000 mg | ORAL_TABLET | ORAL | Status: DC
  Administered 2015-03-09: 20 mg via ORAL
  Filled 2015-03-09: qty 1

## 2015-03-09 NOTE — Progress Notes (Signed)
ANTIBIOTIC CONSULT NOTE - INITIAL  Pharmacy Consult for Renal dose adjustment of antibiotics Indication: pneumonia  No Known Allergies  Patient Measurements: Height: 5\' 3"  (160 cm) Weight: 149 lb 4 oz (67.7 kg) IBW/kg (Calculated) : 52.4  Vital Signs: Temp: 97.4 F (36.3 C) (12/30 2350) Temp Source: Axillary (12/30 2350) BP: 136/74 mmHg (12/30 2350) Pulse Rate: 112 (12/30 2350) Intake/Output from previous day:   Intake/Output from this shift:    Labs:  Recent Labs  03/08/15 1857  WBC 11.0*  HGB 12.1  PLT 241  CREATININE 1.31*   Estimated Creatinine Clearance: 27.9 mL/min (by C-G formula based on Cr of 1.31). No results for input(s): VANCOTROUGH, VANCOPEAK, VANCORANDOM, GENTTROUGH, GENTPEAK, GENTRANDOM, TOBRATROUGH, TOBRAPEAK, TOBRARND, AMIKACINPEAK, AMIKACINTROU, AMIKACIN in the last 72 hours.   Microbiology: No results found for this or any previous visit (from the past 720 hour(s)).  Medical History: Past Medical History  Diagnosis Date  . Stroke [facility](HCC)     residual left hemineglect  . Coronary artery disease   . Atrial fibrillation (HCC)   . Hypertension   . Dementia   . Hiatal hernia     Medications:  Scheduled:  . sodium chloride   Intravenous STAT  . dabigatran  75 mg Oral Q12H  . darifenacin  15 mg Oral Daily  . diltiazem  180 mg Oral Daily  . docusate sodium  200 mg Oral Daily  . feeding supplement (ENSURE ENLIVE)  237 mL Oral BID BM  . furosemide  20 mg Oral QODAY  . hydrALAZINE  50 mg Oral BID  . levofloxacin (LEVAQUIN) IV  250 mg Intravenous Q24H  . memantine  28 mg Oral Daily  . metoprolol  50 mg Oral BID  . multivitamin with minerals  1 tablet Oral Daily  . pantoprazole  40 mg Oral Daily  . polyethylene glycol  17 g Oral BID  . saccharomyces boulardii  250 mg Oral BID  . simvastatin  10 mg Oral Daily   Infusions:   Assessment:  Carolyn Sanders with CAP.  Received initial dose of Levaquin 500mg  IV x 1 @ 22:22 on 12/30  Upon admission,  Levaquing 500mg  IV q24h x 5 doses ordered to continue.  Pharmacy consulted to adjust dosage of antibiotics based on renal function as needed  Scr = 1.31 with CrCl = 27 ml/min  Goal of Therapy:  Eradication of infection Dosage according to renal function  Plan:   Beginning on 12/31 will adjust Levaquin to 250mg  IV q24h x 5 days based on CrCl < 50 ml/min  Follow renal function   Daniel Johndrow, Joselyn GlassmanLeann Trefz, PharmD 03/09/2015,1:04 AM

## 2015-03-09 NOTE — Progress Notes (Signed)
Progress Note   Carolyn Sanders WUJ:[id number] DOB: 08/04/1925 DOA: 03/08/2015 PCP: Zoila ShutterWOODYEAR,WYNNE E, MD   Brief Narrative:   Carolyn PaulaMarleen A Sanders is an 81 y.o. female the PMH of CAD, hypertension, dementia, prior stroke who was admitted 03/08/15 with a 2-3 day history of fever, cough, and vomiting. Upon initial evaluation in the ED, the patient was found to have acute kidney injury. Chest x-ray was clear.  Assessment/Plan:   Principal Problem:   CAP (community acquired pneumonia)/hypoxia - Although chest x-ray was negative, clinical findings consistent with pneumonia (hypoxia, cough). - Continue IV Levaquin. - Provide supplemental oxygen as needed.  Active Problems:   Dementia - Continue Namenda.    AKI (acute kidney injury) (HCC)/stage III chronic kidney disease - Gently hydrating.    Weakness  - Likely secondary to acute illness and acute kidney injury. - Physical therapy evaluation when more stable.    Chronic atrial fibrillation (HCC) - Continue Pradaxa and Diltiazem and Metoprolol Rx.    Hypertension - Continue metoprolol and diltiazem.    Anorexia - Dietitian consultation requested. Liberalize diet.    DM type 2 (diabetes mellitus, type 2) (HCC) - Monitoring blood glucoses. Continue SSI.    DVT Prophylaxis - On Pradaxa.   Family Communication/Anticipated D/C date and plan/Code Status   Family Communication: Daughter at the bedside. Disposition Plan: Home when stable. Anticipated D/C date:   2-3 days. Code Status: Full code.   IV Access:    Peripheral IV   Procedures and diagnostic studies:   Dg Chest 2 View  03/08/2015  CLINICAL DATA:  Initial encounter for mild cough in 1 year drank over the last couple of days. EXAM: CHEST  2 VIEW COMPARISON:  02/15/2015. FINDINGS: The cardio pericardial silhouette is enlarged. There is pulmonary vascular congestion without overt pulmonary edema. Interstitial markings are diffusely coarsened with chronic  features. Left base opacity is stable. Moderate hiatal hernia noted. Bones are diffusely demineralized. IMPRESSION: Stable exam. Cardiomegaly with vascular congestion and underlying chronic interstitial changes. Left base chronic atelectasis or scarring. Moderate to large hiatal hernia. Electronically Signed   By: Kennith CenterEric  Mansell M.D.   On: 03/08/2015 21:15     Medical Consultants:    None.  Anti-Infectives:   Levaquin 03/08/15--->   Subjective:    Carolyn Sanders sitting up in the chair and is without complaints. The patient's daughter is present and reports that she has had a cough and is weaker than at her baseline. Patient tells me she wants to go home.  Objective:    Filed Vitals:   03/08/15 2300 03/08/15 2330 03/08/15 2350 03/09/15 0513  BP: 123/66 134/74 136/74 145/68  Pulse: 94 83 112 95  Temp:   97.4 F (36.3 C) 97.5 F (36.4 C)  TempSrc:   Axillary Axillary  Resp:   20 20  Height:   5\' 3"  (1.6 m)   Weight:   67.7 kg (149 lb 4 oz)   SpO2: 99% 99% 100% 98%    Intake/Output Summary (Last 24 hours) at 03/09/15 0900 Last data filed at 03/09/15 0600  Gross per 24 hour  Intake    370 ml  Output      0 ml  Net    370 ml   Filed Weights   03/08/15 2350  Weight: 67.7 kg (149 lb 4 oz)    Exam: Gen:  NAD Cardiovascular:  RRR, No M/R/G Respiratory:  Lungs diminished Gastrointestinal:  Abdomen soft, NT/ND, + BS Extremities:  No  C/E/C   Data Reviewed:    Labs: Basic Metabolic Panel:  Recent Labs Lab 03/08/15 1857  NA 144  K 3.7  CL 105  CO2 26  GLUCOSE 167*  BUN 26*  CREATININE 1.31*  CALCIUM 9.1   GFR Estimated Creatinine Clearance: 27.9 mL/min (by C-G formula based on Cr of 1.31). Liver Function Tests:  Recent Labs Lab 03/08/15 1857  AST 34  ALT 22  ALKPHOS 110  BILITOT 1.0  PROT 7.1  ALBUMIN 4.1    Recent Labs Lab 03/08/15 1857  LIPASE 25   CBC:  Recent Labs Lab 03/08/15 1857  WBC 11.0*  HGB 12.1  HCT 38.0  MCV 94.5    PLT 241   Sepsis Labs:  Recent Labs Lab 03/08/15 1857 03/08/15 2220  WBC 11.0*  --   LATICACIDVEN  --  1.86   Microbiology Recent Results (from the past 240 hour(s))  Blood culture (routine x 2)     Status: None (Preliminary result)   Collection Time: 03/08/15 12:29 AM  Result Value Ref Range Status   Specimen Description   Final    BLOOD LEFT HAND Performed at [facility]    Special Requests IN PEDIATRIC BOTTLE [facility]  Final   Culture PENDING  Incomplete   Report Status PENDING  Incomplete     Medications:   . sodium chloride   Intravenous STAT  . antiseptic oral rinse  7 mL Mouth Rinse BID  . dabigatran  75 mg Oral Q12H  . darifenacin  15 mg Oral Daily  . diltiazem  180 mg Oral Daily  . docusate sodium  200 mg Oral Daily  . feeding supplement (ENSURE ENLIVE)  237 mL Oral BID BM  . furosemide  20 mg Oral QODAY  . hydrALAZINE  50 mg Oral BID  . levofloxacin (LEVAQUIN) IV  250 mg Intravenous Q24H  . memantine  28 mg Oral Daily  . metoprolol  50 mg Oral BID  . multivitamin with minerals  1 tablet Oral Daily  . pantoprazole  40 mg Oral Daily  . polyethylene glycol  17 g Oral BID  . saccharomyces boulardii  250 mg Oral BID  . simvastatin  10 mg Oral Daily   Continuous Infusions:   Time spent: 35 minutes with > 50% of time discussing current diagnostic test results, clinical impression and plan of care.   LOS: 1 day   RAMA,CHRISTINA  [facility] Pager [phone]. If unable to reach me by pager, please call my cell phone at [phone].  *Please refer to [email or web address], password [email or web address] to get updated schedule on who will round on this patient, as hospitalists switch teams weekly. If 7PM-7AM, please contact night-coverage at [email or web address], password [email or web address] for any overnight needs.  03/09/2015, 9:00 AM

## 2015-03-09 NOTE — Progress Notes (Signed)
Initial Nutrition Assessment  INTERVENTION:   Continue Ensure Enlive po BID, each supplement provides 350 kcal and 20 grams of protein RD to follow-up at a later date to complete assessment.  NUTRITION DIAGNOSIS:   Inadequate oral intake related to lethargy/confusion (dementia) as evidenced by per patient/family report.  GOAL:   Patient will meet greater than or equal to 90% of their needs  MONITOR:   PO intake, Supplement acceptance, Labs, Weight trends, I & O's  REASON FOR ASSESSMENT:   Consult Assessment of nutrition requirement/status  ASSESSMENT:   81 y.o. female with a history of CAD, HTN, Dementia, previous CVA who was brought to ED due to increased Confusion and Weakness and poor PO intake for the past 2-3 days. Her daughter reports that she had a fever to 102 at home and had been coughing and was given tylenol for her Fever prior to arrival. Patient had an episode of vomiting x1 today.  Unable to speak with pt or family at bedside. Curtains closed and staff were in room. RD to follow-up with patient later. Per H&P, pt has not been eating well and has been vomiting. Pt's weight has remained stable. Ensures have been ordered. No PO documented in EPIC.  Labs reviewed: Elevated BUN & Creatinine  Diet Order:  Diet regular Room service appropriate?: Yes; Fluid consistency:: Thin  Skin:  Reviewed, no issues  Last BM:  PTA  Height:   Ht Readings from Last 1 Encounters:  03/08/15 5\' 3"  (1.6 m)    Weight:   Wt Readings from Last 1 Encounters:  03/08/15 149 lb 4 oz (67.7 kg)    Ideal Body Weight:  52.3 kg  BMI:  Body mass index is 26.45 kg/(m^2).  Estimated Nutritional Needs:   Kcal:  1500-1700  Protein:  80-90g  Fluid:  1.6L/day  EDUCATION NEEDS:   No education needs identified at this time  Tilda FrancoLindsey Analiza Cowger, MS, RD, LDN Pager: [phone] After Hours Pager: [phone]

## 2015-03-10 LAB — CBC
HEMATOCRIT: 34.9 % — AB (ref 36.0–46.0)
Hemoglobin: 11.2 g/dL — ABNORMAL LOW (ref 12.0–15.0)
MCH: 30.4 pg (ref 26.0–34.0)
MCHC: 32.1 g/dL (ref 30.0–36.0)
MCV: 94.6 fL (ref 78.0–100.0)
Platelets: 175 10*3/uL (ref 150–400)
RBC: 3.69 MIL/uL — ABNORMAL LOW (ref 3.87–5.11)
RDW: 13.8 % (ref 11.5–15.5)
WBC: 7 10*3/uL (ref 4.0–10.5)

## 2015-03-10 LAB — BASIC METABOLIC PANEL
ANION GAP: 10 (ref 5–15)
BUN: 25 mg/dL — AB (ref 6–20)
CO2: 29 mmol/L (ref 22–32)
Calcium: 9.2 mg/dL (ref 8.9–10.3)
Chloride: 106 mmol/L (ref 101–111)
Creatinine, Ser: 1.19 mg/dL — ABNORMAL HIGH (ref 0.44–1.00)
GFR calc Af Amer: 46 mL/min — ABNORMAL LOW (ref >60)
GFR, EST NON AFRICAN AMERICAN: 40 mL/min — AB (ref >60)
Glucose, Bld: 103 mg/dL — ABNORMAL HIGH (ref 65–99)
POTASSIUM: 4 mmol/L (ref 3.5–5.1)
SODIUM: 145 mmol/L (ref 135–145)

## 2015-03-10 MED ORDER — LEVOFLOXACIN 250 MG PO TABS: 250.0000 mg | ORAL_TABLET | Freq: Every day | ORAL | Status: DC

## 2015-03-10 MED ORDER — LEVOFLOXACIN IN D5W 250 MG/50ML IV SOLN: 250.0000 mg | INTRAVENOUS | Status: DC

## 2015-03-10 NOTE — Discharge Instructions (Signed)

## 2015-03-10 NOTE — Evaluation (Signed)
Physical Therapy Evaluation Patient Details Name: Carolyn Sanders MRN: [id number] DOB: October 05, 1925 Today's Date: 03/10/2015   History of Present Illness  Carolyn Sanders is a 82 y.o. female with a history of CAD, HTN, Dementia, previous CVA who was brought to ED due to increased Confusion and Weakness and poor PO intake for the past 2-3 days; dx with CAP  Clinical Impression  Patient evaluated by Physical Therapy with no further acute PT needs identified. All education has been completed and the patient has no further questions.  See below for any follow-up Physical Therapy or equipment needs. PT is signing off. Thank you for this referral.       Follow Up Recommendations No PT follow up (see above)    Equipment Recommendations    none   Recommendations for Other Services       Precautions / Restrictions Precautions Precautions: Fall      Mobility  Bed Mobility Overal bed mobility: Needs Assistance Bed Mobility: Supine to Sit     Supine to sit: Min guard;Supervision     General bed mobility comments: for safety and supervision  Transfers Overall transfer level: Needs assistance Equipment used: Rolling walker (2 wheeled) Transfers: Sit to/from Stand Sit to Stand: Min guard         General transfer comment: for safety and to steady upon rising  Ambulation/Gait Ambulation/Gait assistance: Min guard;Min assist Ambulation Distance (Feet): 240 Feet Assistive device: 1 person hand held assist;2 person hand held assist Gait Pattern/deviations: Decreased stride length;Wide base of support;Drifts right/left;Shuffle     General Gait Details: pt states she does not use a RW at home but she does have one; requires intermittitent min assist for occasional posterior sway/hesitancy wtih forward motion; likely furniture ambulator at home and may be close to her baseline   Stairs            Wheelchair Mobility    Modified Rankin (Stroke Patients Only)        Balance Overall balance assessment: Needs assistance   Sitting balance-Leahy Scale: Fair Sitting balance - Comments: at least fair     Standing balance-Leahy Scale: Fair                               Pertinent Vitals/Pain Pain Assessment: Faces Faces Pain Scale: No hurt    Home Living Family/patient expects to be discharged to:: Private residence Living Arrangements: Children Available Help at Discharge: Family (available per MD most all of the time)           Home Equipment: Walker - 2 wheels Additional Comments: hx of dementia, no family present, per chart  pt  lives with daughter and has RW ( pt reports she doesn't use)    Prior Function Level of Independence: Needs assistance   Gait / Transfers Assistance Needed: likely at least  supervision (?)     Comments: no family present     Hand Dominance        Extremity/Trunk Assessment   Upper Extremity Assessment: Overall WFL for tasks assessed           Lower Extremity Assessment: Overall WFL for tasks assessed         Communication   Communication: HOH  Cognition Arousal/Alertness: Awake/alert Behavior During Therapy: WFL for tasks assessed/performed Overall Cognitive Status: Within Functional Limits for tasks assessed  General Comments      Exercises        Assessment/Plan    PT Assessment Patent does not need any further PT services (unless family feels she is not at baseline)  PT Diagnosis Difficulty walking   PT Problem List    PT Treatment Interventions     PT Goals (Current goals can be found in the Care Plan section) Acute Rehab PT Goals PT Goal Formulation: All assessment and education complete, DC therapy    Frequency     Barriers to discharge        Co-evaluation               End of Session Equipment Utilized During Treatment: Gait belt Activity Tolerance: Patient tolerated treatment well Patient left: in chair;with  call bell/phone within reach;with chair alarm set Nurse Communication: Mobility status         Time: 0959-1010 PT Time Calculation (min) (ACUTE ONLY): 11 min   Charges:   PT Evaluation $Initial PT Evaluation Tier I: 1 Procedure     PT G CodesDrucilla Chalet:        Amarra Sawyer 03/10/2015, 12:27 PM

## 2015-03-10 NOTE — Discharge Summary (Signed)
Physician Discharge Summary  Carolyn Sanders:[id number] DOB: [birth year] DOA: 03/08/2015  PCP: Zoila Shutter, MD  Admit date: 03/08/2015 Discharge date: 03/10/2015   Recommendations for Outpatient Follow-Up:   1. F/U with PCP in 2 weeks for hospital F/U. Please F/U final blood culture results.   Discharge Diagnosis:   Principal Problem:    CAP (community acquired pneumonia) Active Problems:    Chronic renal disease, stage 3, moderately decreased glomerular filtration rate (GFR) between 30-59 mL/min/1.73 square meter    Dementia    AKI (acute kidney injury) (HCC)    Weakness    Chronic atrial fibrillation (HCC)    Hypertension    Hypoxia    Anorexia    DM type 2 (diabetes mellitus, type 2) (HCC)   Discharge disposition:  Home.    Discharge Condition: Improved.  Diet recommendation: Carbohydrate-modified.    History of Present Illness:   Carolyn Sanders is an 82 y.o. female the PMH of CAD, hypertension, dementia, prior stroke who was admitted 03/08/15 with a 2-3 day history of fever, cough, and vomiting. Upon initial evaluation in the ED, the patient was found to have acute kidney injury. Chest x-ray was clear.  Hospital Course by Problem:   Principal Problem:  CAP (community acquired pneumonia)/hypoxia - Although chest x-ray was negative, clinical findings consistent with pneumonia (hypoxia, cough). - D/C home on 5 days of therapy with oral Levaquin.  Active Problems:  Dementia - Continue Namenda.   AKI (acute kidney injury) (HCC)/stage III chronic kidney disease - Creatinine improved with gentle hydration.   Weakness  - Likely secondary to acute illness and acute kidney injury. - Physical therapy evaluation performed: Functioning at baseline level.   Chronic atrial fibrillation (HCC) - Continue Pradaxa and Diltiazem and Metoprolol Rx.   Hypertension - Continue metoprolol and diltiazem.   Anorexia - Diet liberalized.    DM type 2 (diabetes mellitus, type 2) (HCC) - Monitoring blood glucoses. Continue SSI.    Medical Consultants:    None.   Discharge Exam:   Filed Vitals:   03/09/15 2208 03/10/15 0609  BP: 145/56 135/62  Pulse: 79 73  Temp: 98.7 F (37.1 C) 98.5 F (36.9 C)  Resp: 18 18   Filed Vitals:   03/09/15 1016 03/09/15 1430 03/09/15 2208 03/10/15 0609  BP: 148/77 131/49 145/56 135/62  Pulse: 90 85 79 73  Temp:  97.8 F (36.6 C) 98.7 F (37.1 C) 98.5 F (36.9 C)  TempSrc:  Axillary Oral Oral  Resp:  20 18 18   Height:      Weight:      SpO2:  95% 93% 92%    Gen:  NAD Cardiovascular:  RRR, No M/R/G Respiratory: Lungs CTAB Gastrointestinal: Abdomen soft, NT/ND with normal active bowel sounds. Extremities: No C/E/C   The results of significant diagnostics from this hospitalization (including imaging, microbiology, ancillary and laboratory) are listed below for reference.     Procedures and Diagnostic Studies:   Dg Chest 2 View  03/08/2015  CLINICAL DATA:  Initial encounter for mild cough in 1 year drank over the last couple of days. EXAM: CHEST  2 VIEW COMPARISON:  02/15/2015. FINDINGS: The cardio pericardial silhouette is enlarged. There is pulmonary vascular congestion without overt pulmonary edema. Interstitial markings are diffusely coarsened with chronic features. Left base opacity is stable. Moderate hiatal hernia noted. Bones are diffusely demineralized. IMPRESSION: Stable exam. Cardiomegaly with vascular congestion and underlying chronic interstitial changes. Left base chronic atelectasis or scarring. Moderate to large  hiatal hernia. Electronically Signed   By: Kennith Center M.D.   On: 03/08/2015 21:15     Labs:   Basic Metabolic Panel:  Recent Labs Lab 03/08/15 1857 03/10/15 0520  NA 144 145  K 3.7 4.0  CL 105 106  CO2 26 29  GLUCOSE 167* 103*  BUN 26* 25*  CREATININE 1.31* 1.19*  CALCIUM 9.1 9.2   GFR Estimated Creatinine Clearance: 30.8 mL/min (by  C-G formula based on Cr of 1.19). Liver Function Tests:  Recent Labs Lab 03/08/15 1857  AST 34  ALT 22  ALKPHOS 110  BILITOT 1.0  PROT 7.1  ALBUMIN 4.1    Recent Labs Lab 03/08/15 1857  LIPASE 25   CBC:  Recent Labs Lab 03/08/15 1857 03/10/15 0520  WBC 11.0* 7.0  HGB 12.1 11.2*  HCT 38.0 34.9*  MCV 94.5 94.6  PLT 241 175   Microbiology Recent Results (from the past 240 hour(s))  Blood culture (routine x 2)     Status: None (Preliminary result)   Collection Time: 03/08/15 12:29 AM  Result Value Ref Range Status   Specimen Description   Final    BLOOD LEFT HAND Performed at [facility]    Special Requests IN PEDIATRIC BOTTLE [facility]  Final   Culture PENDING  Incomplete   Report Status PENDING  Incomplete     Discharge Instructions:   Discharge Instructions    Call MD for:  extreme fatigue    Complete by:  As directed      Call MD for:  temperature >100.4    Complete by:  As directed      Diet - low sodium heart healthy    Complete by:  As directed      Increase activity slowly    Complete by:  As directed      Walk with assistance    Complete by:  As directed      Walker     Complete by:  As directed             Medication List    STOP taking these medications        amoxicillin-clavulanate 250-62.5 MG/5ML suspension  Commonly known as:  AUGMENTIN     cephALEXin 250 MG capsule  Commonly known as:  KEFLEX     furosemide 20 MG tablet  Commonly known as:  LASIX      TAKE these medications        acetaminophen 325 MG tablet  Commonly known as:  TYLENOL  Take 2 tablets (650 mg total) by mouth every 6 (six) hours as needed for mild pain (or Fever >/= 101).     dabigatran 75 MG Caps capsule  Commonly known as:  PRADAXA  Take 75 mg by mouth every 12 (twelve) hours.     diltiazem 180 MG 24 hr capsule  Commonly known as:  DILACOR XR  Take 180 mg by mouth daily.     docusate sodium 100 MG capsule  Commonly known as:  COLACE  Take  200 mg by mouth daily.     feeding supplement (ENSURE ENLIVE) Liqd  Take 237 mLs by mouth 2 (two) times daily between meals.     hydrALAZINE 50 MG tablet  Commonly known as:  APRESOLINE  Take 50 mg by mouth 2 (two) times daily.     levofloxacin 250 MG tablet  Commonly known as:  LEVAQUIN  Take 1 tablet (250 mg total) by mouth daily.  memantine 28 MG Cp24 24 hr capsule  Commonly known as:  NAMENDA XR  Take 28 mg by mouth daily.     metoprolol 50 MG tablet  Commonly known as:  LOPRESSOR  Take 50 mg by mouth 2 (two) times daily.     multivitamin with minerals Tabs tablet  Take 1 tablet by mouth daily.     pantoprazole 40 MG tablet  Commonly known as:  PROTONIX  Take 40 mg by mouth daily.     polyethylene glycol packet  Commonly known as:  MIRALAX / GLYCOLAX  Take 17 g by mouth 2 (two) times daily.     saccharomyces boulardii 250 MG capsule  Commonly known as:  FLORASTOR  Take 1 capsule (250 mg total) by mouth 2 (two) times daily.     simvastatin 10 MG tablet  Commonly known as:  ZOCOR  Take 10 mg by mouth daily.     solifenacin 10 MG tablet  Commonly known as:  VESICARE  Take 10 mg by mouth daily.           Follow-up Information    [facility], [facility]. Schedule an appointment as soon as possible for a visit in 1 week.   Specialty:  Internal Medicine   Why:  Hospital follow up.   Contact information:   [address] [place] [address] [phone]        Time coordinating discharge: 35 minutes.  Signed:  Kendalyn Cranfield  Pager [phone] [facility] 03/10/2015, 11:19 AM

## 2015-03-11 LAB — LEGIONELLA ANTIGEN, URINE

## 2015-03-14 LAB — CULTURE, BLOOD (ROUTINE X 2)
CULTURE: NO GROWTH
Culture: NO GROWTH

## 2015-09-12 ENCOUNTER — Observation Stay (HOSPITAL_COMMUNITY)
Admission: EM | Admit: 2015-09-12 | Discharge: 2015-09-13 | Disposition: A | Discharge status: Home / Self Care | Payer: Medicare Other | Attending: Internal Medicine | Admitting: Internal Medicine

## 2015-09-12 ENCOUNTER — Encounter (HOSPITAL_COMMUNITY): Payer: Self-pay | Admitting: Emergency Medicine

## 2015-09-12 ENCOUNTER — Emergency Department (HOSPITAL_COMMUNITY): Payer: Medicare Other

## 2015-09-12 DIAGNOSIS — Z7901 Long term (current) use of anticoagulants: Secondary | ICD-10-CM | POA: Diagnosis not present

## 2015-09-12 DIAGNOSIS — I5031 Acute diastolic (congestive) heart failure: Secondary | ICD-10-CM | POA: Insufficient documentation

## 2015-09-12 DIAGNOSIS — E1122 Type 2 diabetes mellitus with diabetic chronic kidney disease: Secondary | ICD-10-CM | POA: Diagnosis not present

## 2015-09-12 DIAGNOSIS — I4891 Unspecified atrial fibrillation: Secondary | ICD-10-CM | POA: Insufficient documentation

## 2015-09-12 DIAGNOSIS — E119 Type 2 diabetes mellitus without complications: Secondary | ICD-10-CM

## 2015-09-12 DIAGNOSIS — I69312 Visuospatial deficit and spatial neglect following cerebral infarction: Secondary | ICD-10-CM | POA: Insufficient documentation

## 2015-09-12 DIAGNOSIS — I2510 Atherosclerotic heart disease of native coronary artery without angina pectoris: Secondary | ICD-10-CM | POA: Diagnosis not present

## 2015-09-12 DIAGNOSIS — F0391 Unspecified dementia with behavioral disturbance: Secondary | ICD-10-CM | POA: Diagnosis not present

## 2015-09-12 DIAGNOSIS — N183 Chronic kidney disease, stage 3 unspecified: Secondary | ICD-10-CM | POA: Diagnosis present

## 2015-09-12 DIAGNOSIS — J811 Chronic pulmonary edema: Secondary | ICD-10-CM

## 2015-09-12 DIAGNOSIS — I130 Hypertensive heart and chronic kidney disease with heart failure and stage 1 through stage 4 chronic kidney disease, or unspecified chronic kidney disease: Secondary | ICD-10-CM | POA: Insufficient documentation

## 2015-09-12 DIAGNOSIS — J189 Pneumonia, unspecified organism: Secondary | ICD-10-CM | POA: Diagnosis not present

## 2015-09-12 DIAGNOSIS — N289 Disorder of kidney and ureter, unspecified: Secondary | ICD-10-CM

## 2015-09-12 DIAGNOSIS — Z79899 Other long term (current) drug therapy: Secondary | ICD-10-CM | POA: Insufficient documentation

## 2015-09-12 DIAGNOSIS — R609 Edema, unspecified: Secondary | ICD-10-CM

## 2015-09-12 DIAGNOSIS — F0390 Unspecified dementia without behavioral disturbance: Secondary | ICD-10-CM | POA: Diagnosis present

## 2015-09-12 DIAGNOSIS — R4182 Altered mental status, unspecified: Secondary | ICD-10-CM

## 2015-09-12 LAB — URINALYSIS, ROUTINE W REFLEX MICROSCOPIC
Bilirubin Urine: NEGATIVE
GLUCOSE, UA: NEGATIVE mg/dL
HGB URINE DIPSTICK: NEGATIVE
Ketones, ur: NEGATIVE mg/dL
LEUKOCYTES UA: NEGATIVE
Nitrite: NEGATIVE
PROTEIN: NEGATIVE mg/dL
SPECIFIC GRAVITY, URINE: 1.008 (ref 1.005–1.030)
pH: 5.5 (ref 5.0–8.0)

## 2015-09-12 LAB — CBC WITH DIFFERENTIAL/PLATELET
Basophils Absolute: 0 10*3/uL (ref 0.0–0.1)
Basophils Relative: 1 %
EOS ABS: 0.3 10*3/uL (ref 0.0–0.7)
Eosinophils Relative: 5 %
HEMATOCRIT: 36.8 % (ref 36.0–46.0)
HEMOGLOBIN: 12 g/dL (ref 12.0–15.0)
LYMPHS ABS: 1.8 10*3/uL (ref 0.7–4.0)
LYMPHS PCT: 26 %
MCH: 31.1 pg (ref 26.0–34.0)
MCHC: 32.6 g/dL (ref 30.0–36.0)
MCV: 95.3 fL (ref 78.0–100.0)
Monocytes Absolute: 0.8 10*3/uL (ref 0.1–1.0)
Monocytes Relative: 11 %
NEUTROS ABS: 4.1 10*3/uL (ref 1.7–7.7)
NEUTROS PCT: 57 %
PLATELETS: 195 10*3/uL (ref 150–400)
RBC: 3.86 MIL/uL — AB (ref 3.87–5.11)
RDW: 14.5 % (ref 11.5–15.5)
WBC: 7.1 10*3/uL (ref 4.0–10.5)

## 2015-09-12 LAB — COMPREHENSIVE METABOLIC PANEL
ALT: 18 U/L (ref 14–54)
ANION GAP: 8 (ref 5–15)
AST: 23 U/L (ref 15–41)
Albumin: 4.1 g/dL (ref 3.5–5.0)
Alkaline Phosphatase: 89 U/L (ref 38–126)
BUN: 30 mg/dL — ABNORMAL HIGH (ref 6–20)
CHLORIDE: 106 mmol/L (ref 101–111)
CO2: 26 mmol/L (ref 22–32)
CREATININE: 1.34 mg/dL — AB (ref 0.44–1.00)
Calcium: 8.8 mg/dL — ABNORMAL LOW (ref 8.9–10.3)
GFR, EST AFRICAN AMERICAN: 40 mL/min — AB (ref >60)
GFR, EST NON AFRICAN AMERICAN: 35 mL/min — AB (ref >60)
Glucose, Bld: 101 mg/dL — ABNORMAL HIGH (ref 65–99)
POTASSIUM: 4 mmol/L (ref 3.5–5.1)
SODIUM: 140 mmol/L (ref 135–145)
Total Bilirubin: 0.4 mg/dL (ref 0.3–1.2)
Total Protein: 6.8 g/dL (ref 6.5–8.1)

## 2015-09-12 LAB — STREP PNEUMONIAE URINARY ANTIGEN: STREP PNEUMO URINARY ANTIGEN: NEGATIVE

## 2015-09-12 LAB — I-STAT CG4 LACTIC ACID, ED: LACTIC ACID, VENOUS: 1.38 mmol/L (ref 0.5–1.9)

## 2015-09-12 LAB — BRAIN NATRIURETIC PEPTIDE: B Natriuretic Peptide: 130.5 pg/mL — ABNORMAL HIGH (ref 0.0–100.0)

## 2015-09-12 MED ORDER — RIVASTIGMINE 4.6 MG/24HR TD PT24
4.6000 mg | MEDICATED_PATCH | Freq: Every day | TRANSDERMAL | Status: DC
  Administered 2015-09-12 – 2015-09-13 (×2): 4.6 mg via TRANSDERMAL
  Filled 2015-09-12 (×2): qty 1

## 2015-09-12 MED ORDER — DILTIAZEM HCL ER 180 MG PO CP24
180.0000 mg | ORAL_CAPSULE | Freq: Every day | ORAL | Status: DC
  Administered 2015-09-12 – 2015-09-13 (×2): 180 mg via ORAL
  Filled 2015-09-12 (×4): qty 1

## 2015-09-12 MED ORDER — HYDRALAZINE HCL 50 MG PO TABS
50.0000 mg | ORAL_TABLET | Freq: Two times a day (BID) | ORAL | Status: DC
  Administered 2015-09-12 – 2015-09-13 (×3): 50 mg via ORAL
  Filled 2015-09-12 (×3): qty 1

## 2015-09-12 MED ORDER — PIMAVANSERIN TARTRATE 17 MG PO TABS: 17.0000 mg | ORAL_TABLET | Freq: Two times a day (BID) | ORAL | Status: DC

## 2015-09-12 MED ORDER — SODIUM CHLORIDE 0.9% FLUSH: 3.0000 mL | INTRAVENOUS | Status: DC | PRN

## 2015-09-12 MED ORDER — AZITHROMYCIN 250 MG PO TABS
500.0000 mg | ORAL_TABLET | ORAL | Status: DC
  Administered 2015-09-13: 500 mg via ORAL
  Filled 2015-09-12: qty 2

## 2015-09-12 MED ORDER — DEXTROSE 5 % IV SOLN
500.0000 mg | Freq: Once | INTRAVENOUS | Status: AC
  Administered 2015-09-12: 500 mg via INTRAVENOUS
  Filled 2015-09-12: qty 500

## 2015-09-12 MED ORDER — DARIFENACIN HYDROBROMIDE ER 7.5 MG PO TB24
7.5000 mg | ORAL_TABLET | Freq: Every day | ORAL | Status: DC
Start: 2015-09-12 — End: 2015-09-13
  Administered 2015-09-12 – 2015-09-13 (×2): 7.5 mg via ORAL
  Filled 2015-09-12 (×2): qty 1

## 2015-09-12 MED ORDER — METOPROLOL TARTRATE 50 MG PO TABS
50.0000 mg | ORAL_TABLET | Freq: Two times a day (BID) | ORAL | Status: DC
  Administered 2015-09-12 – 2015-09-13 (×3): 50 mg via ORAL
  Filled 2015-09-12 (×3): qty 1

## 2015-09-12 MED ORDER — DABIGATRAN ETEXILATE MESYLATE 75 MG PO CAPS
75.0000 mg | ORAL_CAPSULE | Freq: Two times a day (BID) | ORAL | Status: DC
  Administered 2015-09-12 – 2015-09-13 (×3): 75 mg via ORAL
  Filled 2015-09-12 (×3): qty 1

## 2015-09-12 MED ORDER — ACETAMINOPHEN 325 MG PO TABS: 650.0000 mg | ORAL_TABLET | Freq: Four times a day (QID) | ORAL | Status: DC | PRN

## 2015-09-12 MED ORDER — SODIUM CHLORIDE 0.9% FLUSH
3.0000 mL | Freq: Two times a day (BID) | INTRAVENOUS | Status: DC
  Administered 2015-09-13: 3 mL via INTRAVENOUS

## 2015-09-12 MED ORDER — DOCUSATE SODIUM 100 MG PO CAPS
100.0000 mg | ORAL_CAPSULE | Freq: Two times a day (BID) | ORAL | Status: DC
  Administered 2015-09-12 – 2015-09-13 (×3): 100 mg via ORAL
  Filled 2015-09-12 (×3): qty 1

## 2015-09-12 MED ORDER — MEMANTINE HCL ER 28 MG PO CP24
28.0000 mg | ORAL_CAPSULE | Freq: Every day | ORAL | Status: DC
  Administered 2015-09-12 – 2015-09-13 (×2): 28 mg via ORAL
  Filled 2015-09-12 (×2): qty 1

## 2015-09-12 MED ORDER — PANTOPRAZOLE SODIUM 40 MG PO TBEC
40.0000 mg | DELAYED_RELEASE_TABLET | Freq: Every day | ORAL | Status: DC
  Administered 2015-09-12 – 2015-09-13 (×2): 40 mg via ORAL
  Filled 2015-09-12 (×2): qty 1

## 2015-09-12 MED ORDER — ONDANSETRON HCL 4 MG PO TABS: 4.0000 mg | ORAL_TABLET | Freq: Four times a day (QID) | ORAL | Status: DC | PRN

## 2015-09-12 MED ORDER — AZITHROMYCIN 250 MG PO TABS: 500.0000 mg | ORAL_TABLET | ORAL | Status: DC

## 2015-09-12 MED ORDER — ENSURE ENLIVE PO LIQD: 237.0000 mL | Freq: Two times a day (BID) | ORAL | Status: DC

## 2015-09-12 MED ORDER — POLYETHYLENE GLYCOL 3350 17 G PO PACK
17.0000 g | PACK | Freq: Three times a day (TID) | ORAL | Status: DC
  Administered 2015-09-12 – 2015-09-13 (×3): 17 g via ORAL
  Filled 2015-09-12 (×4): qty 1

## 2015-09-12 MED ORDER — ONDANSETRON HCL 4 MG/2ML IJ SOLN: 4.0000 mg | Freq: Four times a day (QID) | INTRAMUSCULAR | Status: DC | PRN

## 2015-09-12 MED ORDER — DEXTROSE 5 % IV SOLN
1.0000 g | Freq: Once | INTRAVENOUS | Status: AC
  Administered 2015-09-12: 1 g via INTRAVENOUS
  Filled 2015-09-12: qty 10

## 2015-09-12 MED ORDER — FUROSEMIDE 10 MG/ML IJ SOLN
20.0000 mg | Freq: Every day | INTRAMUSCULAR | Status: DC
  Administered 2015-09-12: 20 mg via INTRAVENOUS
  Filled 2015-09-12: qty 2

## 2015-09-12 MED ORDER — SODIUM CHLORIDE 0.9 % IV SOLN: 250.0000 mL | INTRAVENOUS | Status: DC | PRN

## 2015-09-12 MED ORDER — SIMVASTATIN 20 MG PO TABS
10.0000 mg | ORAL_TABLET | Freq: Every day | ORAL | Status: DC
  Administered 2015-09-12: 10 mg via ORAL
  Filled 2015-09-12: qty 1

## 2015-09-12 MED ORDER — DEXTROSE 5 % IV SOLN: 500.0000 mg | Freq: Once | INTRAVENOUS | Status: DC

## 2015-09-12 MED ORDER — DEXTROSE 5 % IV SOLN
1.0000 g | INTRAVENOUS | Status: DC
  Administered 2015-09-13: 1 g via INTRAVENOUS
  Filled 2015-09-12: qty 10

## 2015-09-12 MED ORDER — ADULT MULTIVITAMIN W/MINERALS CH
1.0000 | ORAL_TABLET | Freq: Every day | ORAL | Status: DC
  Administered 2015-09-12 – 2015-09-13 (×2): 1 via ORAL
  Filled 2015-09-12 (×2): qty 1

## 2015-09-12 NOTE — Plan of Care (Signed)
Problem: Safety: Goal: Ability to remain free from injury will improve Outcome: Not Progressing Pt confused, continually getting out of bed without calling for assistance. Reminded to call when needing assistance out of bed. Bed alarm on, call bell within reach & bed in lowest position.

## 2015-09-12 NOTE — ED Notes (Signed)
Bed: WA15 Expected date:  Expected time:  Means of arrival:  Comments: EMS  

## 2015-09-12 NOTE — Progress Notes (Signed)
PHARMACY NOTE -  ANTIBIOTIC RENAL DOSE ADJUSTMENT   Request received for Pharmacy to assist with antibiotic renal dose adjustment.  Patient has been initiated on ceftriaxone and azithromycin for CAP. SCr 1.34, estimated CrCl 26 ml/min  Plan: -current dosage is appropriate and need for further dosage adjustment appears unlikely at present. -will sign off at this time.    Thank you,  Juliette Alcideustin Zeigler, PharmD, BCPS.   Pager: [phone] 09/12/2015 8:46 AM

## 2015-09-12 NOTE — Progress Notes (Signed)
CM unable to deliver MOON to pt as she is very confused; CM placed call (with no identifiers per HIIPA)  for daughter, Carmelina Daneherese Freeman [phone], to explain MOON and left message for Mordecai Maesherese to please callback.  CM waiting for callback.

## 2015-09-12 NOTE — ED Notes (Signed)
MD at bedside. ADMIITTING MD PRESENT

## 2015-09-12 NOTE — H&P (Addendum)
History and Physical    Carolyn Sanders:[id number]:[id number] DOB: 10/26/1925 DOA: 09/12/2015  PCP: Zoila ShutterWOODYEAR,WYNNE E, MD  Patient coming from: Home  Chief Complaint: aggressive behavior.   HPI: Carolyn PaulaMarleen A Littles is a 82 y.o. female with medical history significant of Dementia, stroke, A fib who presents with behavior changes. History obtain from daughter. Patient unable to contribute to history due to dementia. Per daughter patient became very aggressive early this morning, very violent toward daughter. This is very unusual for patient. When patient gets develops behavior problems is usually in setting of infections daughter relates.  Per daughter patient has been coughing, productive for last few days. Denies fever.    ED Course: Presents with aggressive behavior changes, cr at 1.34, WBC 7.1, UA negative for infection, chest x ray: vascular congestion and cardiomegaly, interstitial marking raise concern for pulmonary edema and or Pneumonia.   Review of Systems: As per HPI otherwise 10 point review of systems negative. Unclear how reliable is. Patient denies dyspnea, chest pain, abdominal pain.    Past Medical History  Diagnosis Date  . Stroke [facility](HCC)     residual left hemineglect  . Coronary artery disease   . Atrial fibrillation (HCC)   . Hypertension   . Dementia   . Hiatal hernia     Past Surgical History  Procedure Laterality Date  . Carotid endarterectomy     Social History:   reports that she has never smoked. She has never used smokeless tobacco. She reports that she does not drink alcohol or use illicit drugs.  No Known Allergies  Family History  Problem Relation Age of Onset  . Breast cancer Mother     Prior to Admission medications   Medication Sig Start Date End Date Taking? Authorizing Provider  acetaminophen (TYLENOL) 325 MG tablet Take 2 tablets (650 mg total) by mouth every 6 (six) hours as needed for mild pain (or Fever >/= 101). 05/11/14   Jeralyn BennettEzequiel Zamora, MD    dabigatran (PRADAXA) 75 MG CAPS Take 75 mg by mouth every 12 (twelve) hours.      Historical Provider, MD  diltiazem (DILACOR XR) 180 MG 24 hr capsule Take 180 mg by mouth daily.    Historical Provider, MD  docusate sodium (COLACE) 100 MG capsule Take 200 mg by mouth daily.     Historical Provider, MD  feeding supplement, ENSURE ENLIVE, (ENSURE ENLIVE) LIQD Take 237 mLs by mouth 2 (two) times daily between meals. Patient not taking: Reported on 03/08/2015 09/25/14   Renae FickleMackenzie Short, MD  hydrALAZINE (APRESOLINE) 50 MG tablet Take 50 mg by mouth 2 (two) times daily.     Historical Provider, MD  levofloxacin (LEVAQUIN) 250 MG tablet Take 1 tablet (250 mg total) by mouth daily. 03/10/15   Maryruth Bunhristina P Rama, MD  memantine (NAMENDA XR) 28 MG CP24 24 hr capsule Take 28 mg by mouth daily.    Historical Provider, MD  metoprolol (LOPRESSOR) 50 MG tablet Take 50 mg by mouth 2 (two) times daily.    Historical Provider, MD  Multiple Vitamin (MULTIVITAMIN WITH MINERALS) TABS tablet Take 1 tablet by mouth daily.    Historical Provider, MD  pantoprazole (PROTONIX) 40 MG tablet Take 40 mg by mouth daily.    Historical Provider, MD  polyethylene glycol (MIRALAX / GLYCOLAX) packet Take 17 g by mouth 2 (two) times daily. 04/18/14   Calvert CantorSaima Rizwan, MD  saccharomyces boulardii (FLORASTOR) 250 MG capsule Take 1 capsule (250 mg total) by mouth 2 (two) times daily.  09/25/14   Renae FickleMackenzie Short, MD  simvastatin (ZOCOR) 10 MG tablet Take 10 mg by mouth daily.    Historical Provider, MD  solifenacin (VESICARE) 10 MG tablet Take 10 mg by mouth daily.    Historical Provider, MD    Physical Exam: Filed Vitals:   09/12/15 0500  BP: 164/71  Pulse: 80  Temp: 98.3 F (36.8 C)  TempSrc: Oral  Resp: 14  SpO2: 95%      Constitutional: NAD, calm, comfortable Filed Vitals:   09/12/15 0500  BP: 164/71  Pulse: 80  Temp: 98.3 F (36.8 C)  TempSrc: Oral  Resp: 14  SpO2: 95%   Eyes: PERRL, lids and conjunctivae normal ENMT:  Mucous membranes are moist. Posterior pharynx clear of any exudate or lesions.Normal dentition.  Neck: normal, supple, no masses, no thyromegaly Respiratory: clear to auscultation bilaterally, no wheezing, no crackles. Normal respiratory effort. No accessory muscle use.  Cardiovascular: Regular rate and rhythm, no murmurs / rubs / gallops. No extremity edema. 2+ pedal pulses. No carotid bruits.  Abdomen: no tenderness, no masses palpated. No hepatosplenomegaly. Bowel sounds positive.  Musculoskeletal: no clubbing / cyanosis. No joint deformity upper and lower extremities. Good ROM, no contractures. Normal muscle tone.  Skin: no rashes, lesions, ulcers. No induration Neurologic: CN 2-12 grossly intact. Sensation intact, DTR normal. Strength 5/5 in all 4.  Psychiatric: pleasantly confuse.    Labs on Admission: I have personally reviewed following labs and imaging studies  CBC:  Recent Labs Lab 09/12/15 0518  WBC 7.1  NEUTROABS 4.1  HGB 12.0  HCT 36.8  MCV 95.3  PLT 195   Basic Metabolic Panel:  Recent Labs Lab 09/12/15 0518  NA 140  K 4.0  CL 106  CO2 26  GLUCOSE 101*  BUN 30*  CREATININE 1.34*  CALCIUM 8.8*   GFR: CrCl cannot be calculated (Unknown ideal weight.). Liver Function Tests:  Recent Labs Lab 09/12/15 0518  AST 23  ALT 18  ALKPHOS 89  BILITOT 0.4  PROT 6.8  ALBUMIN 4.1   Urine analysis:    Component Value Date/Time   COLORURINE YELLOW 09/12/2015 0534   APPEARANCEUR CLEAR 09/12/2015 0534   LABSPEC 1.008 09/12/2015 0534   PHURINE 5.5 09/12/2015 0534   GLUCOSEU NEGATIVE 09/12/2015 0534   HGBUR NEGATIVE 09/12/2015 0534   BILIRUBINUR NEGATIVE 09/12/2015 0534   KETONESUR NEGATIVE 09/12/2015 0534   PROTEINUR NEGATIVE 09/12/2015 0534   UROBILINOGEN 1.0 09/23/2014 0800   NITRITE NEGATIVE 09/12/2015 0534   LEUKOCYTESUR NEGATIVE 09/12/2015 0534   Sepsis Labs:   )No results found for this or any previous visit (from the past 240 hour(s)).    Radiological Exams on Admission: Dg Chest Port 1 View  09/12/2015  CLINICAL DATA:  Acute onset of altered mental status. Initial encounter. EXAM: PORTABLE CHEST 1 VIEW COMPARISON:  Chest radiograph performed 03/08/2015 FINDINGS: The lungs are well-aerated. Vascular congestion is noted. Increased interstitial markings raise concern for pulmonary edema, though pneumonia could have a similar appearance. There is no evidence of pleural effusion or pneumothorax. The cardiomediastinal silhouette is enlarged. No acute osseous abnormalities are seen. Clips are seen overlying the right axilla. IMPRESSION: Vascular congestion and cardiomegaly. Increased interstitial markings raise concern for pulmonary edema, though pneumonia could have a similar appearance. Electronically Signed   By: Roanna RaiderJeffery  Chang M.D.   On: 09/12/2015 05:51    EKG: Will order EKG   Assessment/Plan Active Problems:   CAP (community acquired pneumonia)   Chronic renal disease, stage 3, moderately decreased  glomerular filtration rate (GFR) between 30-59 mL/min/1.73 square meter   A-fib (HCC)   Dementia   DM type 2 (diabetes mellitus, type 2) (HCC)  1-PNA; Patient presents with agitation, cough, chest x ray with possible PNA.  Will treat empirically for PNA.  Sputum culture ordered.  Continue with ceftriaxone and Azithromycin.  Chest x ray with infiltrates HF vs PNA. Will order IV lasix and repeat Chest x ray in 24 hours.  Blood culture.  Swallow evaluation.   2-Dementia, Behavior problems.  Could be delirium on top of dementia.  Treat for PNA> HF. Sitter at bedside.  Continue with home medications.   3-Acute mild Diastolic HF. Chest x ray with possible pulmonary edema.  IV lasix 20 mg daily.  BNP not significantly elevated.  Repeat chest x ray tomorrow.   4-A fib;  Continue with metoprolol and Diltiazem.  Continue with pradaxa.   5-HTN;  Continue with hydralazine, diltiazem, metoprolol.    DVT prophylaxis:  Lovenox.  Code Status: full code, discussed with daughter  Family Communication:  Disposition Plan: discharge in 2 to 3 days.  Consults called: none Admission status: observation, telemetry    Hartley Barefoot A MD [facility] Pager [phone]  If 7PM-7AM, please contact night-coverage [email or web address] Password [email or web address]  09/12/2015, 7:23 AM

## 2015-09-12 NOTE — ED Provider Notes (Signed)
CSN: [id number]     Arrival date & time 09/12/15  [id number] History   First MD Initiated Contact with Patient 09/12/15 0454     Chief Complaint  Patient presents with  . Aggressive Behavior  . Urinary Tract Infection     (Consider location/radiation/quality/duration/timing/severity/associated sxs/prior Treatment) Patient is a 82 y.o. female presenting with urinary tract infection. The history is provided by the EMS personnel. The history is limited by the condition of the patient (Dementia).  Urinary Tract Infection She arrived via ambulance from home where she was noted to be combative. This is unusual for her. In the past, this is happening when she has had an occult infection-especially a urinary tract infection.  Past Medical History  Diagnosis Date  . Stroke [facility](HCC)     residual left hemineglect  . Coronary artery disease   . Atrial fibrillation (HCC)   . Hypertension   . Dementia   . Hiatal hernia    Past Surgical History  Procedure Laterality Date  . Carotid endarterectomy     Family History  Problem Relation Age of Onset  . Breast cancer Mother    Social History  Substance Use Topics  . Smoking status: Never Smoker   . Smokeless tobacco: Never Used  . Alcohol Use: No   OB History    No data available     Review of Systems  Unable to perform ROS: Dementia      Allergies  Review of patient's allergies indicates no known allergies.  Home Medications   Prior to Admission medications   Medication Sig Start Date End Date Taking? Authorizing Provider  acetaminophen (TYLENOL) 325 MG tablet Take 2 tablets (650 mg total) by mouth every 6 (six) hours as needed for mild pain (or Fever >/= 101). 05/11/14   Jeralyn BennettEzequiel Zamora, MD  dabigatran (PRADAXA) 75 MG CAPS Take 75 mg by mouth every 12 (twelve) hours.      Historical Provider, MD  diltiazem (DILACOR XR) 180 MG 24 hr capsule Take 180 mg by mouth daily.    Historical Provider, MD  docusate sodium (COLACE) 100 MG capsule  Take 200 mg by mouth daily.     Historical Provider, MD  feeding supplement, ENSURE ENLIVE, (ENSURE ENLIVE) LIQD Take 237 mLs by mouth 2 (two) times daily between meals. Patient not taking: Reported on 03/08/2015 09/25/14   Renae FickleMackenzie Short, MD  hydrALAZINE (APRESOLINE) 50 MG tablet Take 50 mg by mouth 2 (two) times daily.     Historical Provider, MD  levofloxacin (LEVAQUIN) 250 MG tablet Take 1 tablet (250 mg total) by mouth daily. 03/10/15   Maryruth Bunhristina P Rama, MD  memantine (NAMENDA XR) 28 MG CP24 24 hr capsule Take 28 mg by mouth daily.    Historical Provider, MD  metoprolol (LOPRESSOR) 50 MG tablet Take 50 mg by mouth 2 (two) times daily.    Historical Provider, MD  Multiple Vitamin (MULTIVITAMIN WITH MINERALS) TABS tablet Take 1 tablet by mouth daily.    Historical Provider, MD  pantoprazole (PROTONIX) 40 MG tablet Take 40 mg by mouth daily.    Historical Provider, MD  polyethylene glycol (MIRALAX / GLYCOLAX) packet Take 17 g by mouth 2 (two) times daily. 04/18/14   Calvert CantorSaima Rizwan, MD  saccharomyces boulardii (FLORASTOR) 250 MG capsule Take 1 capsule (250 mg total) by mouth 2 (two) times daily. 09/25/14   Renae FickleMackenzie Short, MD  simvastatin (ZOCOR) 10 MG tablet Take 10 mg by mouth daily.    Historical Provider, MD  solifenacin (VESICARE)  10 MG tablet Take 10 mg by mouth daily.    Historical Provider, MD   BP 164/71 mmHg  Pulse 80  Temp(Src) 98.3 F (36.8 C) (Oral)  Resp 14  SpO2 95% Physical Exam  Nursing note and vitals reviewed.  82 year old female, resting comfortably and in no acute distress. Vital signs are significant for hypertension. Oxygen saturation is 95%, which is normal. Head is normocephalic and atraumatic. PERRLA, EOMI. Oropharynx is clear. Neck is nontender and supple without adenopathy or JVD. Back is nontender and there is no CVA tenderness. Lungs are clear without rales, wheezes, or rhonchi. Chest is nontender. Heart has regular rate and rhythm without murmur. Abdomen is  soft, flat, nontender without masses or hepatosplenomegaly and peristalsis is normoactive. Extremities have 1+ edema, full range of motion is present. Skin is warm and dry without rash. Neurologic: She is awake and alert but barely oriented to person and not oriented to place or time, cranial nerves are intact, there are no motor or sensory deficits.  ED Course  Procedures (including critical care time) Labs Review Results for orders placed or performed during the hospital encounter of 09/12/15  Comprehensive metabolic panel  Result Value Ref Range   Sodium 140 135 - 145 mmol/L   Potassium 4.0 3.5 - 5.1 mmol/L   Chloride 106 101 - 111 mmol/L   CO2 26 22 - 32 mmol/L   Glucose, Bld 101 (H) 65 - 99 mg/dL   BUN 30 (H) 6 - 20 mg/dL   Creatinine, Ser 3.081.34 (H) 0.44 - 1.00 mg/dL   Calcium 8.8 (L) 8.9 - 10.3 mg/dL   Total Protein 6.8 6.5 - 8.1 g/dL   Albumin 4.1 3.5 - 5.0 g/dL   AST 23 15 - 41 U/L   ALT 18 14 - 54 U/L   Alkaline Phosphatase 89 38 - 126 U/L   Total Bilirubin 0.4 0.3 - 1.2 mg/dL   GFR calc non Af Amer 35 (L) >60 mL/min   GFR calc Af Amer 40 (L) >60 mL/min   Anion gap 8 5 - 15  CBC with Differential  Result Value Ref Range   WBC 7.1 4.0 - 10.5 K/uL   RBC 3.86 (L) 3.87 - 5.11 MIL/uL   Hemoglobin 12.0 12.0 - 15.0 g/dL   HCT 65.736.8 84.636.0 - 96.246.0 %   MCV 95.3 78.0 - 100.0 fL   MCH 31.1 26.0 - 34.0 pg   MCHC 32.6 30.0 - 36.0 g/dL   RDW 95.214.5 84.111.5 - 32.415.5 %   Platelets 195 150 - 400 K/uL   Neutrophils Relative % 57 %   Neutro Abs 4.1 1.7 - 7.7 K/uL   Lymphocytes Relative 26 %   Lymphs Abs 1.8 0.7 - 4.0 K/uL   Monocytes Relative 11 %   Monocytes Absolute 0.8 0.1 - 1.0 K/uL   Eosinophils Relative 5 %   Eosinophils Absolute 0.3 0.0 - 0.7 K/uL   Basophils Relative 1 %   Basophils Absolute 0.0 0.0 - 0.1 K/uL  Urinalysis, Routine w reflex microscopic  Result Value Ref Range   Color, Urine YELLOW YELLOW   APPearance CLEAR CLEAR   Specific Gravity, Urine 1.008 1.005 - 1.030    pH 5.5 5.0 - 8.0   Glucose, UA NEGATIVE NEGATIVE mg/dL   Hgb urine dipstick NEGATIVE NEGATIVE   Bilirubin Urine NEGATIVE NEGATIVE   Ketones, ur NEGATIVE NEGATIVE mg/dL   Protein, ur NEGATIVE NEGATIVE mg/dL   Nitrite NEGATIVE NEGATIVE   Leukocytes, UA NEGATIVE NEGATIVE  I-Stat CG4 Lactic Acid, ED  Result Value Ref Range   Lactic Acid, Venous 1.38 0.5 - 1.9 mmol/L    Imaging Review Dg Chest Port 1 View  09/12/2015  CLINICAL DATA:  Acute onset of altered mental status. Initial encounter. EXAM: PORTABLE CHEST 1 VIEW COMPARISON:  Chest radiograph performed 03/08/2015 FINDINGS: The lungs are well-aerated. Vascular congestion is noted. Increased interstitial markings raise concern for pulmonary edema, though pneumonia could have a similar appearance. There is no evidence of pleural effusion or pneumothorax. The cardiomediastinal silhouette is enlarged. No acute osseous abnormalities are seen. Clips are seen overlying the right axilla. IMPRESSION: Vascular congestion and cardiomegaly. Increased interstitial markings raise concern for pulmonary edema, though pneumonia could have a similar appearance. Electronically Signed   By: Roanna Raider M.D.   On: 09/12/2015 05:51   I have personally reviewed and evaluated these images and lab results as part of my medical decision-making.   MDM   Final diagnoses:  Community acquired pneumonia  Renal insufficiency  Altered mental status, unspecified altered mental status type    Altered mental status likely from occult infection. Old records are reviewed and she has several hospitalizations for community-acquired pneumonia. Screening labs will be obtained as well as urinalysis and chest x-ray.  Urinalysis shows no evidence of infection, but chest x-ray appears to show a retrocardiac infiltrate. Family has arrived and they stated that she has been coughing recently. Because of altered mentation, lactic acid was obtained to evaluate for possible sepsis and  has come back normal. She also has normal heart rate and normal blood pressure, so is not started on the sepsis pathway. Case is discussed with Dr. Sunnie Nielsen of [facility] who agrees to admit the patient under observation status.  Dione Booze, MD 09/12/15 0730

## 2015-09-12 NOTE — ED Notes (Signed)
MD at bedside. EDP GLICK PRESENT

## 2015-09-12 NOTE — ED Notes (Signed)
Pt comes to ed via ems, c/o family says is overly aggressive and verbally aggressive. Pt comes from home [address], [place], [phone]. Pt has a hx of UTI's and urine as foul odor stated by ems. Pt is alert, combative at times, and no acting like herself verbalized by family. Daughter in room, says her mom is not acting right. Pt has a medical history alzheimers, HTN, diabetes type 2, chronic kidney disease and CHF. No known allergies reported. V/s on arrival are 172/104, pulse 88, rr16, ( needs Spo2  And CBG on assessment )

## 2015-09-12 NOTE — Progress Notes (Signed)
NUTRITION NOTE  Patient identified on the Malnutrition Screening Tool (MST) Report; MST score changed by RN earlier this AM as it was previously 3 and is now 1. Pt no longer screens for a positive score.   Wt Readings from Last 15 Encounters:  09/12/15 153 lb (69.4 kg)  03/08/15 149 lb 4 oz (67.7 kg)  11/03/14 145 lb (65.772 kg)  09/25/14 154 lb 1.6 oz (69.9 kg)  06/06/14 149 lb 9.6 oz (67.858 kg)  05/16/14 149 lb 9.6 oz (67.858 kg)  05/15/14 149 lb 9.6 oz (67.858 kg)  05/09/14 150 lb 6.4 oz (68.221 kg)  04/15/14 152 lb 12.5 oz (69.3 kg)    Body mass index is 28.92 kg/(m^2). Patient meets criteria for overweight based on current BMI.   Current diet order is Heart Healthy. Labs and medications reviewed. Discontinued Ensure Enlive order at this time as it had previously been placed per ONS protocol with positive MST screen.  No nutrition interventions warranted at this time. If nutrition issues arise, please consult RD.      Trenton GammonJessica Linnae Rasool, MS, RD, LDN Inpatient Clinical Dietitian Pager # [phone] After hours/weekend pager # [phone]

## 2015-09-12 NOTE — Progress Notes (Signed)
Daughter Rosey Batheresa to bring patient's home medication Pimavanserin tomorrow 07/07.

## 2015-09-13 ENCOUNTER — Observation Stay (HOSPITAL_COMMUNITY): Payer: Medicare Other

## 2015-09-13 DIAGNOSIS — I4891 Unspecified atrial fibrillation: Secondary | ICD-10-CM

## 2015-09-13 DIAGNOSIS — J189 Pneumonia, unspecified organism: Secondary | ICD-10-CM | POA: Diagnosis not present

## 2015-09-13 LAB — COMPREHENSIVE METABOLIC PANEL
ALBUMIN: 4.3 g/dL (ref 3.5–5.0)
ALK PHOS: 92 U/L (ref 38–126)
ALT: 19 U/L (ref 14–54)
AST: 27 U/L (ref 15–41)
Anion gap: 6 (ref 5–15)
BUN: 34 mg/dL — ABNORMAL HIGH (ref 6–20)
CALCIUM: 9.3 mg/dL (ref 8.9–10.3)
CO2: 31 mmol/L (ref 22–32)
CREATININE: 1.48 mg/dL — AB (ref 0.44–1.00)
Chloride: 103 mmol/L (ref 101–111)
GFR calc Af Amer: 35 mL/min — ABNORMAL LOW (ref >60)
GFR calc non Af Amer: 31 mL/min — ABNORMAL LOW (ref >60)
GLUCOSE: 119 mg/dL — AB (ref 65–99)
Potassium: 4.2 mmol/L (ref 3.5–5.1)
SODIUM: 140 mmol/L (ref 135–145)
Total Bilirubin: 0.7 mg/dL (ref 0.3–1.2)
Total Protein: 7.4 g/dL (ref 6.5–8.1)

## 2015-09-13 LAB — CBC
HCT: 40.7 % (ref 36.0–46.0)
HEMOGLOBIN: 13.1 g/dL (ref 12.0–15.0)
MCH: 30.8 pg (ref 26.0–34.0)
MCHC: 32.2 g/dL (ref 30.0–36.0)
MCV: 95.8 fL (ref 78.0–100.0)
Platelets: 253 10*3/uL (ref 150–400)
RBC: 4.25 MIL/uL (ref 3.87–5.11)
RDW: 14.6 % (ref 11.5–15.5)
WBC: 8.4 10*3/uL (ref 4.0–10.5)

## 2015-09-13 MED ORDER — BISACODYL 10 MG RE SUPP
10.0000 mg | Freq: Once | RECTAL | Status: AC
  Administered 2015-09-13: 10 mg via RECTAL
  Filled 2015-09-13: qty 1

## 2015-09-13 MED ORDER — FUROSEMIDE 20 MG PO TABS
20.0000 mg | ORAL_TABLET | Freq: Every day | ORAL | Status: DC
  Administered 2015-09-13: 20 mg via ORAL
  Filled 2015-09-13: qty 1

## 2015-09-13 MED ORDER — AZITHROMYCIN 500 MG PO TABS: 500.0000 mg | ORAL_TABLET | Freq: Every day | ORAL | Status: DC

## 2015-09-13 NOTE — Evaluation (Signed)
Clinical/Bedside Swallow Evaluation Patient Details  Name: Carolyn Sanders MRN: [id number] Date of Birth: 04/08/1925  Today's Date: 09/13/2015 Time: SLP Start Time (ACUTE ONLY): 1050 SLP Stop Time (ACUTE ONLY): 1105 SLP Time Calculation (min) (ACUTE ONLY): 15 min  Past Medical History:  Past Medical History  Diagnosis Date  . Stroke [facility](HCC)     residual left hemineglect  . Coronary artery disease   . Atrial fibrillation (HCC)   . Hypertension   . Dementia   . Hiatal hernia    Past Surgical History:  Past Surgical History  Procedure Laterality Date  . Carotid endarterectomy     HPI:  82 yo female adm with agitation toward her daughter and found to have pna.  PMH + for dementia.  Swallow evaluation ordered.  RN reports good tolerance of po intake.     Assessment / Plan / Recommendation Clinical Impression  Pt presents with functional oropharyngeal swallow ability = no indication of aspiration/airway compromise.  CN exam unremarkable.  Pt is VERY HOH and SlP secured a Pocket Talker for her to communication - effective at level 3.   Pt denies dysphagia prior to admission.  Do not anticipate swallowing impairment as source of pna. Recommend regular/thin = SlP to sign off.      Aspiration Risk  Mild aspiration risk    Diet Recommendation Regular;Thin liquid   Liquid Administration via: Cup;Straw Medication Administration: Whole meds with liquid Supervision: Patient able to self feed Compensations: Slow rate;Small sips/bites    Other  Recommendations Oral Care Recommendations: Oral care BID   Follow up Recommendations       Frequency and Duration            Prognosis        Swallow Study   General Date of Onset: 09/13/15 HPI: 82 yo female adm with agitation toward her daughter and found to have pna.  PMH + for dementia.  Swallow evaluation ordered.  RN reports good tolerance of po intake.   Type of Study: Bedside Swallow Evaluation Diet Prior to this Study:  Regular;Thin liquids Temperature Spikes Noted: No Respiratory Status: Room air History of Recent Intubation: No Behavior/Cognition: Alert;Cooperative;Pleasant mood Oral Cavity Assessment: Within Functional Limits Oral Care Completed by SLP: No Oral Cavity - Dentition: Adequate natural dentition (missing some dentition) Vision: Functional for self-feeding Self-Feeding Abilities: Able to feed self Patient Positioning: Upright in bed Baseline Vocal Quality: Normal Volitional Cough: Cognitively unable to elicit Volitional Swallow: Able to elicit    Oral/Motor/Sensory Function Overall Oral Motor/Sensory Function: Within functional limits   Ice Chips Ice chips: Not tested   Thin Liquid Thin Liquid: Within functional limits Presentation: Self Fed;Straw    Nectar Thick Nectar Thick Liquid: Not tested   Honey Thick Honey Thick Liquid: Not tested   Puree Puree: Not tested   Solid   GO   Solid: Within functional limits Presentation: Self Fed Other Comments: minimal amount of residuals on hard palate with pt awareness, pt initiated swallows faciliate clearance    Functional Assessment Tool Used: clinical judgement Functional Limitations: Swallowing Swallow Current Status (W1027(G8996): At least 1 percent but less than 20 percent impaired, limited or restricted Swallow Goal Status [phone](G8997): At least 1 percent but less than 20 percent impaired, limited or restricted Swallow Discharge Status [phone](G8998): At least 1 percent but less than 20 percent impaired, limited or restricted   Donavan Burnetamara Tennie Grussing, MS [facility] SLP [phone]

## 2015-09-13 NOTE — Evaluation (Signed)
Occupational Therapy Evaluation Patient Details Name: Carolyn Sanders MRN: [id number] DOB: 02/10/1926 Today's Date: 09/13/2015    History of Present Illness 82 yo female admitted with Pna. Hx of dementia, CVA, A fib, HTN   Clinical Impression   Pt was admitted for the above.  Unsure of pt's PLOF, but plan is for discharge home with daughter this afternoon.  Pt needs 24/7 and assistance for thoroughness with adls.  If she does not have a shower seat, she would benefit from this for increased safety (unless she sponge bathes)    Follow Up Recommendations  Supervision/Assistance - 24 hour    Equipment Recommendations  None recommended by OT;Other (comment) (needs shower seat if she doesn't have one)    Recommendations for Other Services       Precautions / Restrictions Precautions Precautions: Fall Restrictions Weight Bearing Restrictions: No      Mobility Bed Mobility               General bed mobility comments: oob  Transfers Overall transfer level: Needs assistance Equipment used: 1 person hand held assist Transfers: Sit to/from Stand Sit to Stand: Min assist         General transfer comment: hand held assistance    Balance Overall balance assessment: Needs assistance         Standing balance support: During functional activity Standing balance-Leahy Scale: Fair                              ADL Overall ADL's : Needs assistance/impaired     Grooming: Wash/dry hands;Wash/dry face;Min guard;Standing   Upper Body Bathing: Minimal assitance;Sitting   Lower Body Bathing: Moderate assistance;Sit to/from stand   Upper Body Dressing : Minimal assistance;Sitting   Lower Body Dressing: Minimal assistance;Sit to/from stand   Toilet Transfer: Minimal assistance;Ambulation;BSC;RW   Toileting- Clothing Manipulation and Hygiene: Minimal assistance;Sit to/from stand         General ADL Comments: performed adl from bsc at sink.  Pt needs  cues to continue and above assistance for thoroughness.  SLP brought amplifier which assisted with pt's hearing instructions     Vision     Perception     Praxis      Pertinent Vitals/Pain Pain Assessment: No/denies pain     Hand Dominance     Extremity/Trunk Assessment Upper Extremity Assessment Upper Extremity Assessment: Overall WFL for tasks assessed      Cervical / Trunk Assessment Cervical / Trunk Assessment: Kyphotic   Communication Communication Communication: HOH   Cognition Arousal/Alertness: Awake/alert Behavior During Therapy: WFL for tasks assessed/performed Overall Cognitive Status: History of cognitive impairments - at baseline                     General Comments       Exercises       Shoulder Instructions      Home Living Family/patient expects to be discharged to:: Private residence Living Arrangements: Children                               Additional Comments: hx of dementia, no family present, per chart  pt  lives with daughter and has RW ( pt reports she doesn't use)      Prior Functioning/Environment Level of Independence: Needs assistance  Gait / Transfers Assistance Needed: likely at least  supervision (?)  Comments: no family present    OT Diagnosis: Cognitive deficits;Generalized weakness   OT Problem List:     OT Treatment/Interventions:      OT Goals(Current goals can be found in the care plan section) Acute Rehab OT Goals Patient Stated Goal: none stated  OT Frequency:     Barriers to D/C:            Co-evaluation              End of Session    Activity Tolerance: Patient tolerated treatment well Patient left: in chair;with call bell/phone within reach;with chair alarm set;with nursing/sitter in room   Time: [id number] OT Time Calculation (min): 23 min Charges:  OT General Charges $OT Visit: 1 Procedure OT Evaluation $OT Eval Moderate Complexity: 1 Procedure G-Codes: OT  G-codes **NOT FOR INPATIENT CLASS** Functional Assessment Tool Used: clinical observation Functional Limitation: Self care Self Care Current Status (V4098(G8987): At least 40 percent but less than 60 percent impaired, limited or restricted Self Care Goal Status (J1914(G8988): At least 40 percent but less than 60 percent impaired, limited or restricted Self Care Discharge Status [phone](G8989): At least 40 percent but less than 60 percent impaired, limited or restricted  Tru Leopard 09/13/2015, 12:36 PM  Marica OtterMaryellen Chisom Aust, OTR/L [phone] 09/13/2015

## 2015-09-13 NOTE — Progress Notes (Signed)
Discharge planning, spoke with patient and daughter at beside. [facility], [facility]. Needs RW, contacted [facility], daughter will pick up at office. [phone]

## 2015-09-13 NOTE — Evaluation (Signed)
Physical Therapy Evaluation Patient Details Name: Carolyn Sanders MRN: [id number] DOB: 01/21/1926 Today's Date: 09/13/2015   History of Present Illness  82 yo female admitted with Pna. Hx of dementia, CVA, A fib, HTN  Clinical Impression  On eval, pt required Min assist for mobility-walked ~120 feet with 1 HHA. Pt participated well with session. She is very HOH. No family present during session. RW use would be beneficial however I am not sure pt has ability to remember to use it. Recommend HHPT follow up as long as family feels they can provide current level of care and supervision. Recommend daily ambulation with nursing supervision.     Follow Up Recommendations Home health PT;Supervision/Assistance - 24 hour    Equipment Recommendations  None recommended by PT (has RW already per chart)    Recommendations for Other Services       Precautions / Restrictions Precautions Precautions: Fall Restrictions Weight Bearing Restrictions: No      Mobility  Bed Mobility               General bed mobility comments: oob in recliner  Transfers Overall transfer level: Needs assistance Equipment used: 1 person hand held assist Transfers: Sit to/from Stand Sit to Stand: Min assist         General transfer comment: Assist to rise, stabilize, control descent.   Ambulation/Gait Ambulation/Gait assistance: Min assist Ambulation Distance (Feet): 120 Feet Assistive device: 1 person hand held assist       General Gait Details: assist to stabilize. unsteady. Pt tolerated distance well.   Stairs            Wheelchair Mobility    Modified Rankin (Stroke Patients Only)       Balance Overall balance assessment: Needs assistance         Standing balance support: During functional activity Standing balance-Leahy Scale: Fair                               Pertinent Vitals/Pain Pain Assessment: No/denies pain    Home Living Family/patient expects to  be discharged to:: Private residence Living Arrangements: Children               Additional Comments: hx of dementia, no family present, per chart  pt  lives with daughter and has RW ( pt reports she doesn't use)    Prior Function Level of Independence: Needs assistance   Gait / Transfers Assistance Needed: likely at least  supervision (?)     Comments: no family present     Hand Dominance        Extremity/Trunk Assessment   Upper Extremity Assessment: Overall WFL for tasks assessed           Lower Extremity Assessment: Generalized weakness      Cervical / Trunk Assessment: Kyphotic  Communication   Communication: HOH  Cognition Arousal/Alertness: Awake/alert Behavior During Therapy: WFL for tasks assessed/performed Overall Cognitive Status: History of cognitive impairments - at baseline                      General Comments      Exercises        Assessment/Plan    PT Assessment Patient needs continued PT services  PT Diagnosis Difficulty walking;Generalized weakness;Altered mental status   PT Problem List Decreased strength;Decreased balance;Decreased mobility;Decreased cognition  PT Treatment Interventions Gait training;Functional mobility training;Therapeutic activities;Therapeutic exercise;Patient/family education;DME instruction  PT Goals (Current goals can be found in the Care Plan section) Acute Rehab PT Goals Patient Stated Goal: none stated PT Goal Formulation: Patient unable to participate in goal setting Time For Goal Achievement: 09/27/15 Potential to Achieve Goals: Good    Frequency Min 3X/week   Barriers to discharge        Co-evaluation               End of Session Equipment Utilized During Treatment: Gait belt Activity Tolerance: Patient tolerated treatment well Patient left: in chair;with call bell/phone within reach;with chair alarm set;with nursing/sitter in room      Functional Assessment Tool Used:  clinical judgement Functional Limitation: Mobility: Walking and moving around Mobility: Walking and Moving Around Current Status [phone](G8978): At least 20 percent but less than 40 percent impaired, limited or restricted Mobility: Walking and Moving Around Goal Status [phone](G8979): At least 1 percent but less than 20 percent impaired, limited or restricted    Time: [id number] PT Time Calculation (min) (ACUTE ONLY): 9 min   Charges:   PT Evaluation $PT Eval Low Complexity: 1 Procedure     PT G Codes:   PT G-Codes **NOT FOR INPATIENT CLASS** Functional Assessment Tool Used: clinical judgement Functional Limitation: Mobility: Walking and moving around Mobility: Walking and Moving Around Current Status (N8295(G8978): At least 20 percent but less than 40 percent impaired, limited or restricted Mobility: Walking and Moving Around Goal Status [phone](G8979): At least 1 percent but less than 20 percent impaired, limited or restricted    Rebeca AlertJannie Taejah Ohalloran, MPT Pager: [phone]

## 2015-09-13 NOTE — Discharge Summary (Signed)
Physician Discharge Summary  Carolyn Sanders ZOX:[id number] DOB: 07/27/1925 DOA: 09/12/2015  PCP: Zoila ShutterWOODYEAR,WYNNE E, MD  Admit date: 09/12/2015 Discharge date: 09/13/2015  Admitted From: Home Disposition: Home   Recommendations for Outpatient Follow-up:  1. Follow up with PCP in 1-2 weeks 2. Please obtain BMP/CBC in one week 3. Please follow up on the following pending results: Blood culture.   Home Health: yes   Discharge Condition: Stable.  CODE STATUS: Full code.  Diet recommendation: Heart Healthy  Brief/Interim Summary:  Brief Narrative: Carolyn Sanders is a 82 y.o. female with medical history significant of Dementia, stroke, A fib who presents with behavior changes. History obtain from daughter. Patient unable to contribute to history due to dementia. Per daughter patient became very aggressive early this morning, very violent toward daughter. This is very unusual for patient. When patient gets develops behavior problems is usually in setting of infections daughter relates.  Per daughter patient has been coughing, productive for last few days. Denies fever.  Presents with aggressive behavior changes, cr at 1.34, WBC 7.1, UA negative for infection, chest x ray: vascular congestion and cardiomegaly, interstitial marking raise concern for pulmonary edema and or Pneumonia.    Assessment & Plan:  1-PNA; Patient presents with agitation, cough, chest x ray with possible PNA.  Will treat empirically for PNA.  Sputum culture ordered.  Continue with ceftriaxone and Azithromycin day 2.  Blood culture pending Swallow evaluation.  Chest x ray improved.  Discharge on azithromycin for 5 days.   2-Dementia, Behavior problems.  Could be delirium on top of dementia.  Treat for PNA> HF. Sitter at bedside. Activity vest  Continue with home medications.  Patient pleasantly confuse.   3-Acute mild Diastolic HF. Chest x ray with possible pulmonary edema.  Change IV lasix 20 mg  daily to oral.  BNP not significantly elevated.  Repeated chest x ray 7-07: infiltrate clear.  Weight down from 69---67  4-A fib;  Continue with metoprolol and Diltiazem.  Continue with pradaxa.   5-HTN;  Continue with hydralazine, diltiazem, metoprolol.   Discharge Diagnoses:    CAP (community acquired pneumonia)   Acute mild Diastolic HF.   Chronic renal disease, stage 3, moderately decreased glomerular filtration rate (GFR) between 30-59    A-fib (HCC)   Dementia   DM type 2 (diabetes mellitus, type 2) [facility](HCC)     Discharge Instructions  Discharge Instructions    Diet - low sodium heart healthy    Complete by:  As directed      Increase activity slowly    Complete by:  As directed             Medication List    TAKE these medications        acetaminophen 325 MG tablet  Commonly known as:  TYLENOL  Take 2 tablets (650 mg total) by mouth every 6 (six) hours as needed for mild pain (or Fever >/= 101).     azithromycin 500 MG tablet  Commonly known as:  ZITHROMAX  Take 1 tablet (500 mg total) by mouth daily.     dabigatran 75 MG Caps capsule  Commonly known as:  PRADAXA  Take 75 mg by mouth 2 (two) times daily.     diltiazem 180 MG 24 hr capsule  Commonly known as:  DILACOR XR  Take 180 mg by mouth daily.     docusate sodium 100 MG capsule  Commonly known as:  COLACE  Take 100 mg by mouth 2 (two)  times daily.     feeding supplement (ENSURE ENLIVE) Liqd  Take 237 mLs by mouth 2 (two) times daily between meals.     furosemide 20 MG tablet  Commonly known as:  LASIX  Take 20 mg by mouth daily.     hydrALAZINE 50 MG tablet  Commonly known as:  APRESOLINE  Take 50 mg by mouth 2 (two) times daily.     memantine 28 MG Cp24 24 hr capsule  Commonly known as:  NAMENDA XR  Take 28 mg by mouth daily.     metoprolol 50 MG tablet  Commonly known as:  LOPRESSOR  Take 50 mg by mouth 2 (two) times daily.     multivitamin with minerals Tabs tablet  Take 1  tablet by mouth daily.     NUPLAZID 17 MG Tabs  Generic drug:  Pimavanserin Tartrate  Take 17 mg by mouth 2 (two) times daily.     pantoprazole 40 MG tablet  Commonly known as:  PROTONIX  Take 40 mg by mouth daily.     polyethylene glycol packet  Commonly known as:  MIRALAX / GLYCOLAX  Take 17 g by mouth 2 (two) times daily.     rivastigmine 4.6 mg/24hr  Commonly known as:  EXELON  Place 4.6 mg onto the skin daily.     simvastatin 10 MG tablet  Commonly known as:  ZOCOR  Take 10 mg by mouth at bedtime.     solifenacin 10 MG tablet  Commonly known as:  VESICARE  Take 5 mg by mouth daily.           Follow-up Information    Follow up with [facility] E, MD In 1 week.   Specialty:  Internal Medicine   Contact information:   [address] [place] [address] [phone]      No Known Allergies  Consultations:  none   Procedures/Studies: Dg Chest 2 View  09/13/2015  CLINICAL DATA:  History of atrial fibrillation, hypertension. EXAM: CHEST  2 VIEW COMPARISON:  09/12/2015 FINDINGS: Large hiatal hernia. Heart is borderline in size. Improving interstitial prominence throughout the lungs, likely improving interstitial edema. Compressive atelectasis in the left base related to the hiatal hernia. Right lung is clear. No effusions or acute bony abnormality. IMPRESSION: Interval clearance of previously seen interstitial edema. Electronically Signed   By: Charlett Nose M.D.   On: 09/13/2015 08:09   Dg Chest Port 1 View  09/12/2015  CLINICAL DATA:  Acute onset of altered mental status. Initial encounter. EXAM: PORTABLE CHEST 1 VIEW COMPARISON:  Chest radiograph performed 03/08/2015 FINDINGS: The lungs are well-aerated. Vascular congestion is noted. Increased interstitial markings raise concern for pulmonary edema, though pneumonia could have a similar appearance. There is no evidence of pleural effusion or pneumothorax. The cardiomediastinal silhouette is  enlarged. No acute osseous abnormalities are seen. Clips are seen overlying the right axilla. IMPRESSION: Vascular congestion and cardiomegaly. Increased interstitial markings raise concern for pulmonary edema, though pneumonia could have a similar appearance. Electronically Signed   By: Roanna Raider M.D.   On: 09/12/2015 05:51       Subjective:   Discharge Exam: Filed Vitals:   09/12/15 2109 09/13/15 0502  BP: 157/56 155/56  Pulse: 72 66  Temp: 98 F (36.7 C) 97.7 F (36.5 C)  Resp: 20 18   Filed Vitals:   09/12/15 1440 09/12/15 2109 09/13/15 0502 09/13/15 0700  BP: 170/86 157/56 155/56   Pulse: 82 72 66  Temp: 97.6 F (36.4 C) 98 F (36.7 C) 97.7 F (36.5 C)   TempSrc: Oral Oral Oral   Resp: 18 20 18    Height:      Weight:    67.495 kg (148 lb 12.8 oz)  SpO2: 95% 94% 97%     General: Pt is alert, awake, not in acute distress Cardiovascular: RRR, S1/S2 +, no rubs, no gallops Respiratory: CTA bilaterally, no wheezing, no rhonchi Abdominal: Soft, NT, ND, bowel sounds + Extremities: no edema, no cyanosis    The results of significant diagnostics from this hospitalization (including imaging, microbiology, ancillary and laboratory) are listed below for reference.     Microbiology: No results found for this or any previous visit (from the past 240 hour(s)).   Labs: BNP (last 3 results)  Recent Labs  09/22/14 1830 09/12/15 0518  BNP 160.5* 130.5*   Basic Metabolic Panel:  Recent Labs Lab 09/12/15 0518 09/13/15 0414  NA 140 140  K 4.0 4.2  CL 106 103  CO2 26 31  GLUCOSE 101* 119*  BUN 30* 34*  CREATININE 1.34* 1.48*  CALCIUM 8.8* 9.3   Liver Function Tests:  Recent Labs Lab 09/12/15 0518 09/13/15 0414  AST 23 27  ALT 18 19  ALKPHOS 89 92  BILITOT 0.4 0.7  PROT 6.8 7.4  ALBUMIN 4.1 4.3   No results for input(s): LIPASE, AMYLASE in the last 168 hours. No results for input(s): AMMONIA in the last 168 hours. CBC:  Recent Labs Lab  09/12/15 0518 09/13/15 0414  WBC 7.1 8.4  NEUTROABS 4.1  --   HGB 12.0 13.1  HCT 36.8 40.7  MCV 95.3 95.8  PLT 195 253   Cardiac Enzymes: No results for input(s): CKTOTAL, CKMB, CKMBINDEX, TROPONINI in the last 168 hours. BNP: Invalid input(s): POCBNP CBG: No results for input(s): GLUCAP in the last 168 hours. D-Dimer No results for input(s): DDIMER in the last 72 hours. Hgb A1c No results for input(s): HGBA1C in the last 72 hours. Lipid Profile No results for input(s): CHOL, HDL, LDLCALC, TRIG, CHOLHDL, LDLDIRECT in the last 72 hours. Thyroid function studies No results for input(s): TSH, T4TOTAL, T3FREE, THYROIDAB in the last 72 hours.  Invalid input(s): FREET3 Anemia work up No results for input(s): VITAMINB12, FOLATE, FERRITIN, TIBC, IRON, RETICCTPCT in the last 72 hours. Urinalysis    Component Value Date/Time   COLORURINE YELLOW 09/12/2015 0534   APPEARANCEUR CLEAR 09/12/2015 0534   LABSPEC 1.008 09/12/2015 0534   PHURINE 5.5 09/12/2015 0534   GLUCOSEU NEGATIVE 09/12/2015 0534   HGBUR NEGATIVE 09/12/2015 0534   BILIRUBINUR NEGATIVE 09/12/2015 0534   KETONESUR NEGATIVE 09/12/2015 0534   PROTEINUR NEGATIVE 09/12/2015 0534   UROBILINOGEN 1.0 09/23/2014 0800   NITRITE NEGATIVE 09/12/2015 0534   LEUKOCYTESUR NEGATIVE 09/12/2015 0534   Sepsis Labs Invalid input(s): PROCALCITONIN,  WBC,  LACTICIDVEN Microbiology No results found for this or any previous visit (from the past 240 hour(s)).   Time coordinating discharge: Over 30 minutes  SIGNED:   Alba Coryegalado, Kinsey Karch A, MD  [facility] 09/13/2015, 10:05 AM Pager [phone]  If 7PM-7AM, please contact night-coverage [email or web address] Password [email or web address]

## 2015-09-17 LAB — CULTURE, BLOOD (ROUTINE X 2)
CULTURE: NO GROWTH
CULTURE: NO GROWTH

## 2015-09-28 ENCOUNTER — Encounter (HOSPITAL_COMMUNITY): Payer: Self-pay | Admitting: *Deleted

## 2015-09-28 ENCOUNTER — Emergency Department (HOSPITAL_COMMUNITY)
Admission: EM | Admit: 2015-09-28 | Discharge: 2015-09-28 | Disposition: A | Discharge status: Home / Self Care | Payer: Medicare Other | Attending: Emergency Medicine | Admitting: Emergency Medicine

## 2015-09-28 DIAGNOSIS — Y999 Unspecified external cause status: Secondary | ICD-10-CM | POA: Insufficient documentation

## 2015-09-28 DIAGNOSIS — I10 Essential (primary) hypertension: Secondary | ICD-10-CM | POA: Insufficient documentation

## 2015-09-28 DIAGNOSIS — Z79891 Long term (current) use of opiate analgesic: Secondary | ICD-10-CM | POA: Diagnosis not present

## 2015-09-28 DIAGNOSIS — F0390 Unspecified dementia without behavioral disturbance: Secondary | ICD-10-CM | POA: Diagnosis present

## 2015-09-28 DIAGNOSIS — I2510 Atherosclerotic heart disease of native coronary artery without angina pectoris: Secondary | ICD-10-CM | POA: Diagnosis not present

## 2015-09-28 DIAGNOSIS — Z8673 Personal history of transient ischemic attack (TIA), and cerebral infarction without residual deficits: Secondary | ICD-10-CM | POA: Diagnosis not present

## 2015-09-28 DIAGNOSIS — Y92019 Unspecified place in single-family (private) house as the place of occurrence of the external cause: Secondary | ICD-10-CM | POA: Insufficient documentation

## 2015-09-28 DIAGNOSIS — I4891 Unspecified atrial fibrillation: Secondary | ICD-10-CM | POA: Diagnosis not present

## 2015-09-28 DIAGNOSIS — Z79899 Other long term (current) drug therapy: Secondary | ICD-10-CM | POA: Insufficient documentation

## 2015-09-28 DIAGNOSIS — W010XXA Fall on same level from slipping, tripping and stumbling without subsequent striking against object, initial encounter: Secondary | ICD-10-CM | POA: Insufficient documentation

## 2015-09-28 DIAGNOSIS — W19XXXA Unspecified fall, initial encounter: Secondary | ICD-10-CM

## 2015-09-28 DIAGNOSIS — Y9301 Activity, walking, marching and hiking: Secondary | ICD-10-CM | POA: Diagnosis not present

## 2015-09-28 NOTE — ED Notes (Signed)
Per EMS Pt coming from home with c/o fall, per EMS pt was ambulating using a walker and she slipped and fell. Pt denies any pain, no obvious injury noted, per EMS family wanted pt to be checked out.

## 2015-09-28 NOTE — ED Notes (Signed)
Pt and family member at bedside given discharge instructions. Pt and family denied further questions or concerns. Pt denied pain. Pt able to ambulate to wheelchair without difficulty. Pt escorted via wheelchair to family members car without difficulty.

## 2015-09-28 NOTE — ED Notes (Signed)
Bed: WA06 Expected date:  Expected time:  Means of arrival:  Comments: fall 

## 2015-09-28 NOTE — ED Provider Notes (Signed)
CSN: [id number]     Arrival date & time 09/28/15  1416 History   First MD Initiated Contact with Patient 09/28/15 1435     Chief Complaint  Patient presents with  . Fall     Level V caveat: Dementia  HPI Patient is brought to the emergency department via EMS from home after an unwitnessed fall today at home.  Family states she's been in her normal state health recently and when they came back in the house they found her lying on the living room floor landing on her back with the walker upright into her side.  Family reports they called EMS and had her transported to the emergency department for evaluation.  He reported the patient had no specific complaints of them they just wanted her "checked out".  Family reports baseline mental status currently.  No recent illness.  No recent diarrhea or fever.  Patient was in her normal state health earlier today.  Patient states she would like to go home and states she is not having any pain  Past Medical History  Diagnosis Date  . Stroke [facility])     residual left hemineglect  . Coronary artery disease   . Atrial fibrillation (HCC)   . Hypertension   . Dementia   . Hiatal hernia    Past Surgical History  Procedure Laterality Date  . Carotid endarterectomy     Family History  Problem Relation Age of Onset  . Breast cancer Mother    Social History  Substance Use Topics  . Smoking status: Never Smoker   . Smokeless tobacco: Never Used  . Alcohol Use: No   OB History    No data available     Review of Systems  Unable to perform ROS: Dementia      Allergies  Review of patient's allergies indicates no known allergies.  Home Medications   Prior to Admission medications   Medication Sig Start Date End Date Taking? Authorizing Provider  acetaminophen (TYLENOL) 325 MG tablet Take 2 tablets (650 mg total) by mouth every 6 (six) hours as needed for mild pain (or Fever >/= 101). 05/11/14  Yes Jeralyn Bennett, MD  dabigatran (PRADAXA) 75 MG  CAPS Take 75 mg by mouth 2 (two) times daily.    Yes Historical Provider, MD  diltiazem (DILACOR XR) 180 MG 24 hr capsule Take 180 mg by mouth daily.   Yes Historical Provider, MD  docusate sodium (COLACE) 100 MG capsule Take 100 mg by mouth 2 (two) times daily.    Yes Historical Provider, MD  feeding supplement, ENSURE ENLIVE, (ENSURE ENLIVE) LIQD Take 237 mLs by mouth 2 (two) times daily between meals. 09/25/14  Yes Renae Fickle, MD  furosemide (LASIX) 20 MG tablet Take 20 mg by mouth daily. 08/10/15  Yes Historical Provider, MD  hydrALAZINE (APRESOLINE) 50 MG tablet Take 50 mg by mouth 2 (two) times daily.    Yes Historical Provider, MD  memantine (NAMENDA XR) 28 MG CP24 24 hr capsule Take 28 mg by mouth daily.   Yes Historical Provider, MD  metoprolol (LOPRESSOR) 50 MG tablet Take 50 mg by mouth 2 (two) times daily.   Yes Historical Provider, MD  Multiple Vitamin (MULTIVITAMIN WITH MINERALS) TABS tablet Take 1 tablet by mouth daily.   Yes Historical Provider, MD  pantoprazole (PROTONIX) 40 MG tablet Take 40 mg by mouth daily.   Yes Historical Provider, MD  Pimavanserin Tartrate (NUPLAZID) 17 MG TABS Take 17 mg by mouth 2 (two) times  daily. 08/21/15  Yes Historical Provider, MD  polyethylene glycol (MIRALAX / GLYCOLAX) packet Take 17 g by mouth 2 (two) times daily. Patient taking differently: Take 17 g by mouth 3 (three) times daily.  04/18/14  Yes Calvert Cantor, MD  rivastigmine (EXELON) 4.6 mg/24hr Place 4.6 mg onto the skin daily. 08/21/15  Yes Historical Provider, MD  simvastatin (ZOCOR) 10 MG tablet Take 10 mg by mouth at bedtime.    Yes Historical Provider, MD  solifenacin (VESICARE) 10 MG tablet Take 5 mg by mouth daily.    Yes Historical Provider, MD  azithromycin (ZITHROMAX) 500 MG tablet Take 1 tablet (500 mg total) by mouth daily. Patient not taking: Reported on 09/28/2015 09/13/15   Belkys A Regalado, MD   BP 159/67 mmHg  Pulse 88  Temp(Src) 97.7 F (36.5 C) (Oral)  Resp 17  SpO2  95% Physical Exam  Constitutional: She appears well-developed and well-nourished. No distress.  HENT:  Head: Normocephalic and atraumatic.  Eyes: EOM are normal.  Neck: Normal range of motion.  Cardiovascular: Normal rate, regular rhythm and normal heart sounds.   Pulmonary/Chest: Effort normal and breath sounds normal.  Abdominal: Soft. She exhibits no distension. There is no tenderness.  Musculoskeletal: Normal range of motion.  Full range of motion bilateral wrists, elbows, shoulders.  Full range of motion bilateral ankles, knees, hips.  Neurological: She is alert.  Skin: Skin is warm and dry.  Psychiatric: She has a normal mood and affect. Judgment normal.  Nursing note and vitals reviewed.   ED Course  Procedures (including critical care time) Labs Review Labs Reviewed - No data to display  Imaging Review No results found. I have personally reviewed and evaluated these images and lab results as part of my medical decision-making.   EKG Interpretation None      MDM   Final diagnoses:  Fall, initial encounter    No apparent injury from the unwitnessed fall today.  She has full range of motion of her major joints.  She has no thoracic or lumbar tenderness.  Her C-spine is nontender.  She has no signs of trauma to her head.  She's been her normal state of health recently.  Patient be discharged home in good condition at this time.  She understands return to the emergency department for new or worsening symptoms.  She's going back home with her daughter who provides her care    Azalia Bilis, MD 09/28/15 1515

## 2015-09-29 ENCOUNTER — Emergency Department (HOSPITAL_COMMUNITY): Payer: Medicare Other

## 2015-09-29 ENCOUNTER — Encounter (HOSPITAL_COMMUNITY): Payer: Self-pay | Admitting: Emergency Medicine

## 2015-09-29 ENCOUNTER — Emergency Department (HOSPITAL_COMMUNITY)
Admission: EM | Admit: 2015-09-29 | Discharge: 2015-09-29 | Disposition: A | Discharge status: Home / Self Care | Payer: Medicare Other | Attending: Emergency Medicine | Admitting: Emergency Medicine

## 2015-09-29 DIAGNOSIS — Y92009 Unspecified place in unspecified non-institutional (private) residence as the place of occurrence of the external cause: Secondary | ICD-10-CM | POA: Diagnosis not present

## 2015-09-29 DIAGNOSIS — Z8673 Personal history of transient ischemic attack (TIA), and cerebral infarction without residual deficits: Secondary | ICD-10-CM | POA: Diagnosis not present

## 2015-09-29 DIAGNOSIS — I2510 Atherosclerotic heart disease of native coronary artery without angina pectoris: Secondary | ICD-10-CM | POA: Insufficient documentation

## 2015-09-29 DIAGNOSIS — S060X0A Concussion without loss of consciousness, initial encounter: Secondary | ICD-10-CM | POA: Insufficient documentation

## 2015-09-29 DIAGNOSIS — Y999 Unspecified external cause status: Secondary | ICD-10-CM | POA: Diagnosis not present

## 2015-09-29 DIAGNOSIS — S0990XA Unspecified injury of head, initial encounter: Secondary | ICD-10-CM | POA: Diagnosis present

## 2015-09-29 DIAGNOSIS — I4891 Unspecified atrial fibrillation: Secondary | ICD-10-CM | POA: Diagnosis not present

## 2015-09-29 DIAGNOSIS — N183 Chronic kidney disease, stage 3 (moderate): Secondary | ICD-10-CM | POA: Diagnosis not present

## 2015-09-29 DIAGNOSIS — W19XXXA Unspecified fall, initial encounter: Secondary | ICD-10-CM | POA: Diagnosis not present

## 2015-09-29 DIAGNOSIS — I129 Hypertensive chronic kidney disease with stage 1 through stage 4 chronic kidney disease, or unspecified chronic kidney disease: Secondary | ICD-10-CM | POA: Diagnosis not present

## 2015-09-29 DIAGNOSIS — Y939 Activity, unspecified: Secondary | ICD-10-CM | POA: Insufficient documentation

## 2015-09-29 DIAGNOSIS — E1122 Type 2 diabetes mellitus with diabetic chronic kidney disease: Secondary | ICD-10-CM | POA: Diagnosis not present

## 2015-09-29 DIAGNOSIS — Z79899 Other long term (current) drug therapy: Secondary | ICD-10-CM | POA: Insufficient documentation

## 2015-09-29 LAB — CBC WITH DIFFERENTIAL/PLATELET
Basophils Absolute: 0 10*3/uL (ref 0.0–0.1)
Basophils Relative: 0 %
Eosinophils Absolute: 0.2 10*3/uL (ref 0.0–0.7)
Eosinophils Relative: 2 %
HEMATOCRIT: 39 % (ref 36.0–46.0)
HEMOGLOBIN: 12.7 g/dL (ref 12.0–15.0)
LYMPHS PCT: 25 %
Lymphs Abs: 3 10*3/uL (ref 0.7–4.0)
MCH: 31.1 pg (ref 26.0–34.0)
MCHC: 32.6 g/dL (ref 30.0–36.0)
MCV: 95.6 fL (ref 78.0–100.0)
MONO ABS: 1.4 10*3/uL — AB (ref 0.1–1.0)
Monocytes Relative: 11 %
NEUTROS ABS: 7.7 10*3/uL (ref 1.7–7.7)
NEUTROS PCT: 62 %
Platelets: 199 10*3/uL (ref 150–400)
RBC: 4.08 MIL/uL (ref 3.87–5.11)
RDW: 14.3 % (ref 11.5–15.5)
WBC: 12.3 10*3/uL — ABNORMAL HIGH (ref 4.0–10.5)

## 2015-09-29 LAB — URINALYSIS, ROUTINE W REFLEX MICROSCOPIC
Bilirubin Urine: NEGATIVE
GLUCOSE, UA: NEGATIVE mg/dL
HGB URINE DIPSTICK: NEGATIVE
Ketones, ur: NEGATIVE mg/dL
LEUKOCYTES UA: NEGATIVE
Nitrite: NEGATIVE
Protein, ur: NEGATIVE mg/dL
SPECIFIC GRAVITY, URINE: 1.012 (ref 1.005–1.030)
pH: 6 (ref 5.0–8.0)

## 2015-09-29 LAB — BASIC METABOLIC PANEL
ANION GAP: 8 (ref 5–15)
BUN: 28 mg/dL — AB (ref 6–20)
CHLORIDE: 103 mmol/L (ref 101–111)
CO2: 29 mmol/L (ref 22–32)
Calcium: 9.3 mg/dL (ref 8.9–10.3)
Creatinine, Ser: 1.33 mg/dL — ABNORMAL HIGH (ref 0.44–1.00)
GFR calc Af Amer: 40 mL/min — ABNORMAL LOW (ref >60)
GFR, EST NON AFRICAN AMERICAN: 35 mL/min — AB (ref >60)
GLUCOSE: 120 mg/dL — AB (ref 65–99)
POTASSIUM: 4.1 mmol/L (ref 3.5–5.1)
SODIUM: 140 mmol/L (ref 135–145)

## 2015-09-29 LAB — TROPONIN I

## 2015-09-29 NOTE — ED Triage Notes (Addendum)
Patient seen here yesterday for a unwitnessed fall. Patient's daughter states patient was been sleeping more, less verbal, and confusion. Patient on pradaxa. Patient is not oriented to person, place, or time per norm. Hx of dementia. Patient is from home. Living with daughter.

## 2015-09-29 NOTE — ED Provider Notes (Signed)
[facility]-EMERGENCY DEPT Provider Note   CSN: [id number] Arrival date & time: 09/29/15  1323  First Provider Contact:  First MD Initiated Contact with Patient 09/29/15 1751     Level V caveat for dementia   History   Chief Complaint Chief Complaint  Patient presents with  . Fatigue  . Dementia    HPI Carolyn Sanders is a 82 y.o. female brought in by daughter for increased sleeping and fatigue. Patient had an unwitnessed fall in the house yesterday. Was brought here but had no apparent injuries and was discharged home. Patient slept from 8 PM to 7 AM and then shortly after awakening went back to sleep. This is atypical for her per the daughter. Patient has confusion at baseline with Alzheimer's dementia but is not more confused than typical. Patient seems to have an unsteady gait, daughters unsure if this is because of left knee pain or from dizziness. Patient has not had any specific complaints today. However when asked about chest pain, daughter states that the patient was complaining about chest pain for about 20 minutes after her fall yesterday. Patient is on Pradaxa.  HPI  Past Medical History:  Diagnosis Date  . Atrial fibrillation (HCC)   . Coronary artery disease   . Dementia   . Hiatal hernia   . Hypertension   . Stroke [facility])    residual left hemineglect    Patient Active Problem List   Diagnosis Date Noted  . Community acquired pneumonia 09/12/2015  . PNA (pneumonia) 09/12/2015  . Hypoxia 03/08/2015  . Anorexia 03/08/2015  . DM type 2 (diabetes mellitus, type 2) (HCC) 03/08/2015  . Aspiration pneumonia (HCC) 09/25/2014  . Sepsis (HCC) 09/23/2014  . Lower extremity edema 09/22/2014  . Hypernatremia 09/22/2014  . Cough 09/22/2014  . Weakness 05/09/2014  . Chronic atrial fibrillation (HCC) 05/09/2014  . Hypertension 05/09/2014  . HCAP (healthcare-associated pneumonia) 05/09/2014  . Pneumonia 05/09/2014  . Obstipation 04/15/2014  . CAP (community acquired  pneumonia) 04/15/2014  . Chronic renal disease, stage 3, moderately decreased glomerular filtration rate (GFR) between 30-59 mL/min/1.73 square meter 04/15/2014  . Urinary retention 04/15/2014  . A-fib (HCC) 04/15/2014  . Dementia 04/15/2014  . AKI (acute kidney injury) [facility])     Past Surgical History:  Procedure Laterality Date  . CAROTID ENDARTERECTOMY      OB History    No data available       Home Medications    Prior to Admission medications   Medication Sig Start Date End Date Taking? Authorizing Provider  acetaminophen (TYLENOL) 325 MG tablet Take 2 tablets (650 mg total) by mouth every 6 (six) hours as needed for mild pain (or Fever >/= 101). 05/11/14  Yes Jeralyn Bennett, MD  dabigatran (PRADAXA) 75 MG CAPS Take 75 mg by mouth 2 (two) times daily.    Yes Historical Provider, MD  diltiazem (DILACOR XR) 180 MG 24 hr capsule Take 180 mg by mouth daily.   Yes Historical Provider, MD  docusate sodium (COLACE) 100 MG capsule Take 100 mg by mouth 2 (two) times daily.    Yes Historical Provider, MD  feeding supplement, ENSURE ENLIVE, (ENSURE ENLIVE) LIQD Take 237 mLs by mouth 2 (two) times daily between meals. 09/25/14  Yes Renae Fickle, MD  furosemide (LASIX) 20 MG tablet Take 20 mg by mouth daily. 08/10/15  Yes Historical Provider, MD  hydrALAZINE (APRESOLINE) 50 MG tablet Take 50 mg by mouth 2 (two) times daily.    Yes Historical Provider, MD  memantine (NAMENDA XR) 28 MG CP24 24 hr capsule Take 28 mg by mouth daily.   Yes Historical Provider, MD  metoprolol (LOPRESSOR) 50 MG tablet Take 50 mg by mouth 2 (two) times daily.   Yes Historical Provider, MD  Multiple Vitamin (MULTIVITAMIN WITH MINERALS) TABS tablet Take 1 tablet by mouth daily.   Yes Historical Provider, MD  pantoprazole (PROTONIX) 40 MG tablet Take 40 mg by mouth daily.   Yes Historical Provider, MD  Pimavanserin Tartrate (NUPLAZID) 17 MG TABS Take 17 mg by mouth 2 (two) times daily. 08/21/15  Yes Historical Provider,  MD  polyethylene glycol (MIRALAX / GLYCOLAX) packet Take 17 g by mouth 2 (two) times daily. Patient taking differently: Take 17 g by mouth 3 (three) times daily.  04/18/14  Yes Calvert Cantor, MD  rivastigmine (EXELON) 4.6 mg/24hr Place 4.6 mg onto the skin daily. 08/21/15  Yes Historical Provider, MD  simvastatin (ZOCOR) 10 MG tablet Take 10 mg by mouth at bedtime.    Yes Historical Provider, MD  solifenacin (VESICARE) 10 MG tablet Take 5 mg by mouth daily.    Yes Historical Provider, MD    Family History Family History  Problem Relation Age of Onset  . Breast cancer Mother     Social History Social History  Substance Use Topics  . Smoking status: Never Smoker  . Smokeless tobacco: Never Used  . Alcohol use No     Allergies   Review of patient's allergies indicates no known allergies.   Review of Systems Review of Systems  Unable to perform ROS: Dementia     Physical Exam Updated Vital Signs BP 146/61   Pulse 76   Temp 99 F (37.2 C) (Oral)   Resp 22   Ht 5\' 2"  (1.575 m)   Wt 148 lb (67.1 kg)   SpO2 92%   BMI 27.07 kg/m   Physical Exam  Constitutional: She appears well-developed and well-nourished.  HENT:  Head: Normocephalic.    Right Ear: External ear normal.  Left Ear: External ear normal.  Nose: Nose normal.  Eyes: Right eye exhibits no discharge. Left eye exhibits no discharge.  Cardiovascular: Normal rate, regular rhythm and normal heart sounds.   Pulmonary/Chest: Effort normal and breath sounds normal.  Abdominal: Soft. There is no tenderness.  Musculoskeletal:       Left knee: She exhibits swelling (mild). Tenderness found.  Neurological: She is alert. She is disoriented.  No facial droop. Alert but confused. Equal strength in all 4 extremities  Skin: Skin is warm and dry.  Nursing note and vitals reviewed.    ED Treatments / Results  Labs (all labs ordered are listed, but only abnormal results are displayed) Labs Reviewed  BASIC METABOLIC  PANEL - Abnormal; Notable for the following:       Result Value   Glucose, Bld 120 (*)    BUN 28 (*)    Creatinine, Ser 1.33 (*)    GFR calc non Af Amer 35 (*)    GFR calc Af Amer 40 (*)    All other components within normal limits  CBC WITH DIFFERENTIAL/PLATELET - Abnormal; Notable for the following:    WBC 12.3 (*)    Monocytes Absolute 1.4 (*)    All other components within normal limits  TROPONIN I  URINALYSIS, ROUTINE W REFLEX MICROSCOPIC (NOT AT [facility])    EKG  EKG Interpretation  Date/Time:  Sunday September 29 2015 18:13:22 EDT Ventricular Rate:  81 PR Interval:  QRS Duration: 79 QT Interval:  419 QTC Calculation: 487 R Axis:   78 Text Interpretation:  Atrial fibrillation Borderline prolonged QT interval Baseline wander in lead(s) II III aVR aVF no significant change since September 12 2015 Confirmed by Criss Alvine MD, Dorlis Judice [phone]) on 09/29/2015 6:58:22 PM       Radiology Dg Chest 2 View  Result Date: 09/29/2015 CLINICAL DATA:  Status post fall yesterday.  Chest pain. EXAM: CHEST  2 VIEW COMPARISON:  PA and lateral chest 09/13/2015 and 05/08/2014. FINDINGS: There is cardiomegaly and vascular congestion. No frank edema. No consolidative process, pneumothorax or effusion. Aortic atherosclerosis is noted. Large hiatal hernia is seen. No fracture or other acute bony abnormality. Surgical clips right axilla are noted. IMPRESSION: No acute disease. Cardiomegaly. Atherosclerosis. Large hiatal hernia. Electronically Signed   By: Drusilla Kanner M.D.   On: 09/29/2015 18:41  Ct Head Wo Contrast  Result Date: 09/29/2015 CLINICAL DATA:  Patient with unwitnessed fall. Right periorbital bruising. EXAM: CT HEAD WITHOUT CONTRAST TECHNIQUE: Contiguous axial images were obtained from the base of the skull through the vertex without intravenous contrast. COMPARISON:  Brain CT 05/09/2014. FINDINGS: Ventricles and sulci are prominent compatible with atrophy. Periventricular and subcortical white matter  hypodensity compatible with chronic microvascular ischemic changes. Remote right lenticulostriate infarct. Orbits are unremarkable.Calvarium is intact. Peritoneal sinuses are unremarkable. Mastoid air cells are well aerated. IMPRESSION: No acute intracranial process. Chronic microvascular ischemic changes. Electronically Signed   By: Annia Belt M.D.   On: 09/29/2015 15:25  Dg Knee Complete 4 Views Left  Result Date: 09/29/2015 CLINICAL DATA:  Status post fall yesterday. Left knee pain. Initial encounter. EXAM: LEFT KNEE - COMPLETE 4+ VIEW COMPARISON:  None. FINDINGS: No acute bony or joint abnormality is identified. There is no joint effusion. Joint spaces are preserved. Chondrocalcinosis is seen. Atherosclerotic vascular disease is noted. IMPRESSION: No acute abnormality. Chondrocalcinosis. Atherosclerosis. Electronically Signed   By: Drusilla Kanner M.D.   On: 09/29/2015 18:42   Procedures Procedures (including critical care time)  Medications Ordered in ED Medications - No data to display   Initial Impression / Assessment and Plan / ED Course  I have reviewed the triage vital signs and the nursing notes.  Pertinent labs & imaging results that were available during my care of the patient were reviewed by me and considered in my medical decision making (see chart for details).  Clinical Course  Comment By Time  Patient awake, confused, but not somnolent. Benign neuro exam otherwise. Will do workup including blood, urine, ECG, and will xray knee and chest. Pricilla Loveless, MD 07/23 1752  Workup is unremarkable, including negative xrays, CT head, and bloodwork. Chronically in afib. Remains well appearing and at baseline. Pricilla Loveless, MD 07/23 1958    Patient is awake and alert and at baseline currently. Likely concussion given mild ecchymosis near eye and increased sleepiness. No head bleed. Possible CP 24 hours ago but no ischemic ECG changes and negative troponin. Discussed with  daughter who feels comfortable going home. I think ACS, PE or dissection is unlikely. Daughter does not want to pursue CP workup and with lower suspicion we discussed going home with PCP f/u and return if new/worse symptoms.  Final Clinical Impressions(s) / ED Diagnoses   Final diagnoses:  Concussion, without loss of consciousness, initial encounter    New Prescriptions Current Discharge Medication List       Pricilla Loveless, MD 09/29/15 2047

## 2017-03-09 DEATH — deceased

## 2017-06-16 IMAGING — DX DG CHEST 1V PORT
1 series · 1 of 1 positions shown · non-contrast
Comparison: Chest radiograph performed 03/08/2015

CLINICAL DATA: Acute onset of altered mental status. Initial
encounter.

EXAM:
PORTABLE CHEST 1 VIEW

[chest ap]
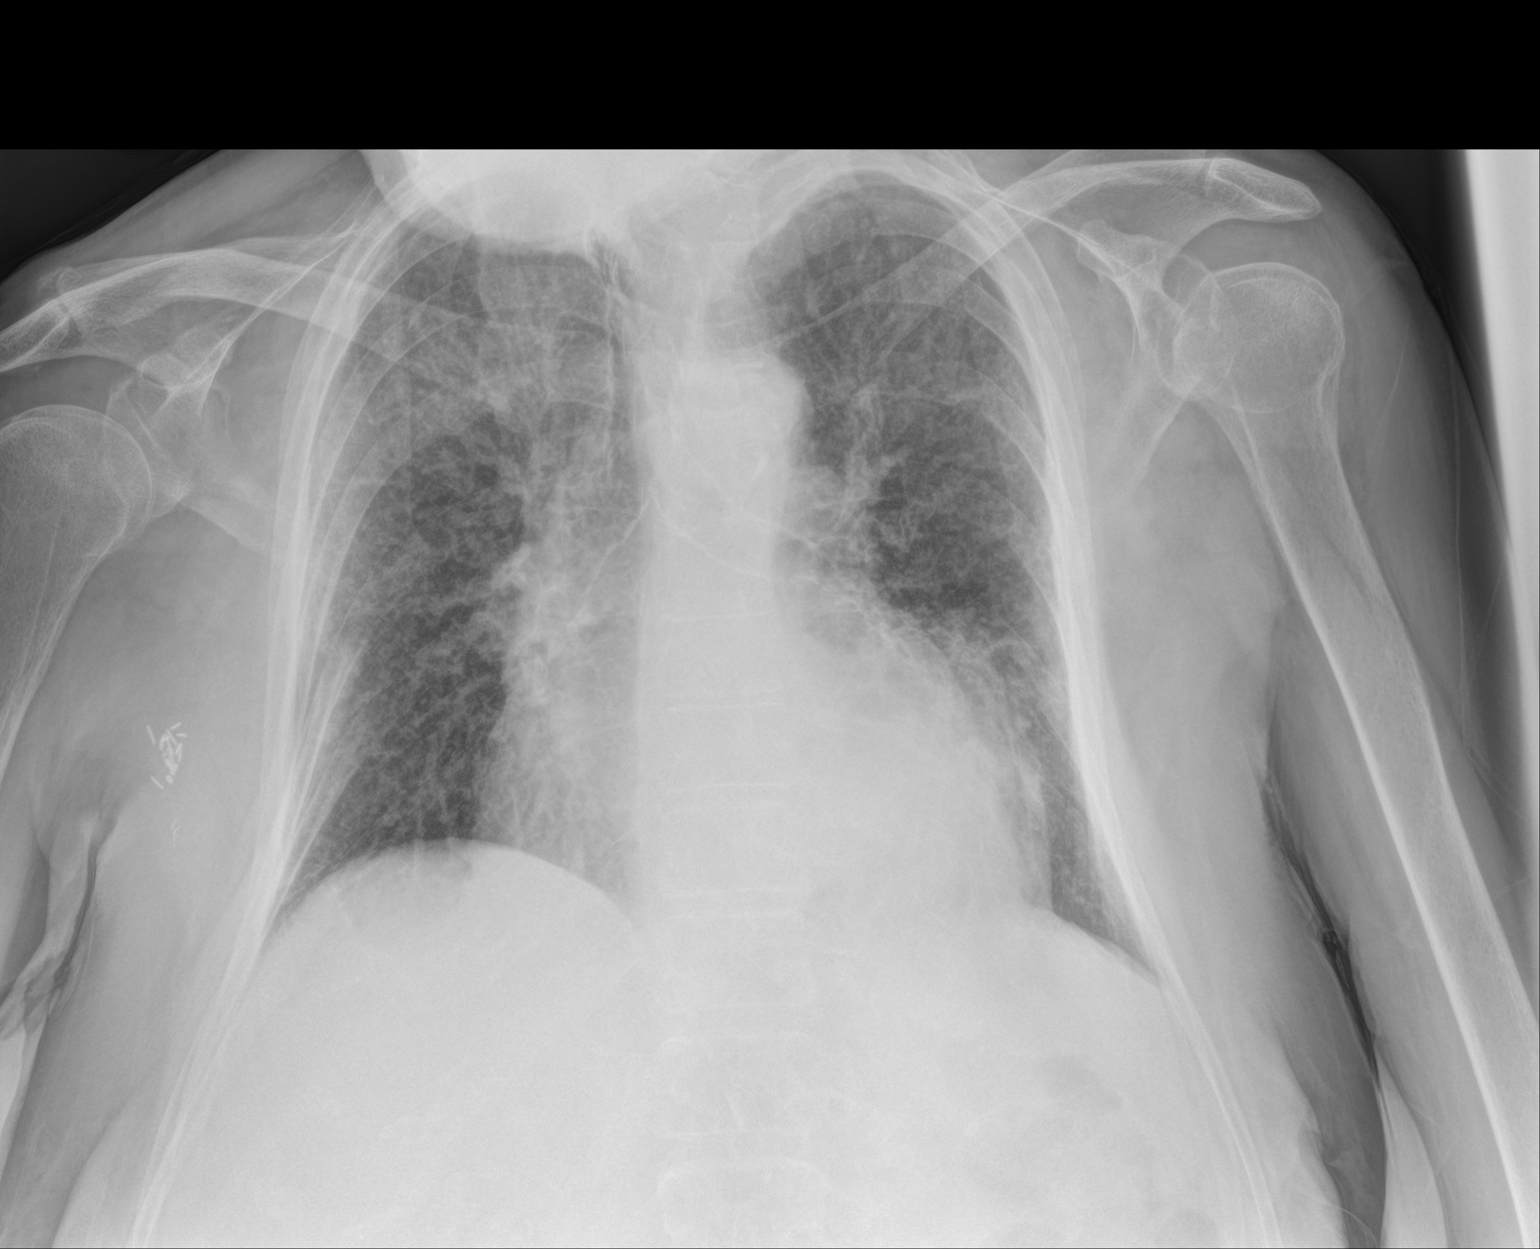

[1 of 1 positions shown; findings below may reference images not displayed]

FINDINGS: The lungs are well-aerated. Vascular congestion is noted. Increased
interstitial markings raise concern for pulmonary edema, though
pneumonia could have a similar appearance. There is no evidence of
pleural effusion or pneumothorax.

The cardiomediastinal silhouette is enlarged. No acute osseous
abnormalities are seen. Clips are seen overlying the right axilla.
IMPRESSION: Vascular congestion and cardiomegaly. Increased interstitial
markings raise concern for pulmonary edema, though pneumonia could
have a similar appearance.

## 2017-06-17 IMAGING — DX DG CHEST 2V
2 series · 2 of 2 positions shown · non-contrast
Comparison: 09/12/2015

CLINICAL DATA: History of atrial fibrillation, hypertension.

EXAM:
CHEST  2 VIEW

[chest pa]
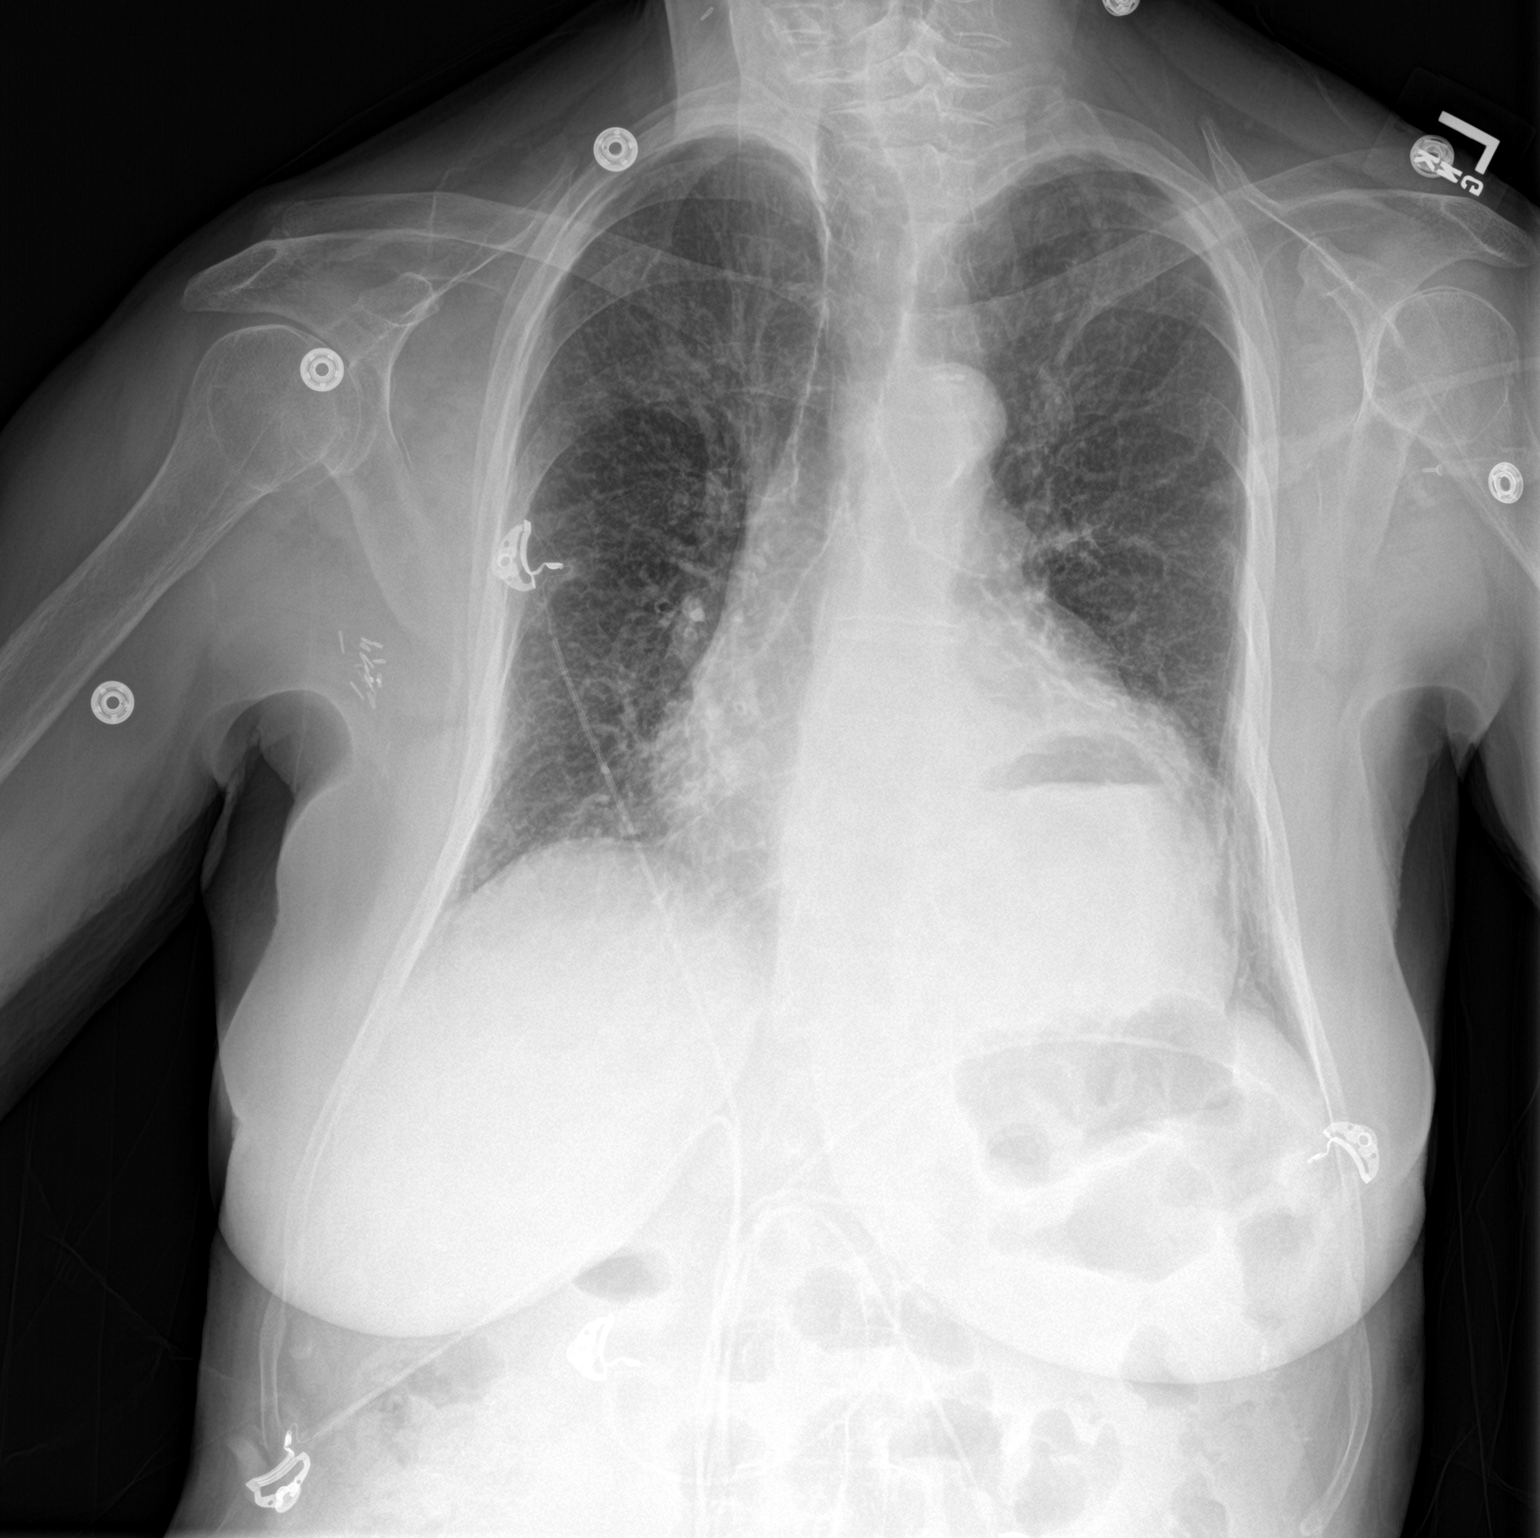

[chest lat]
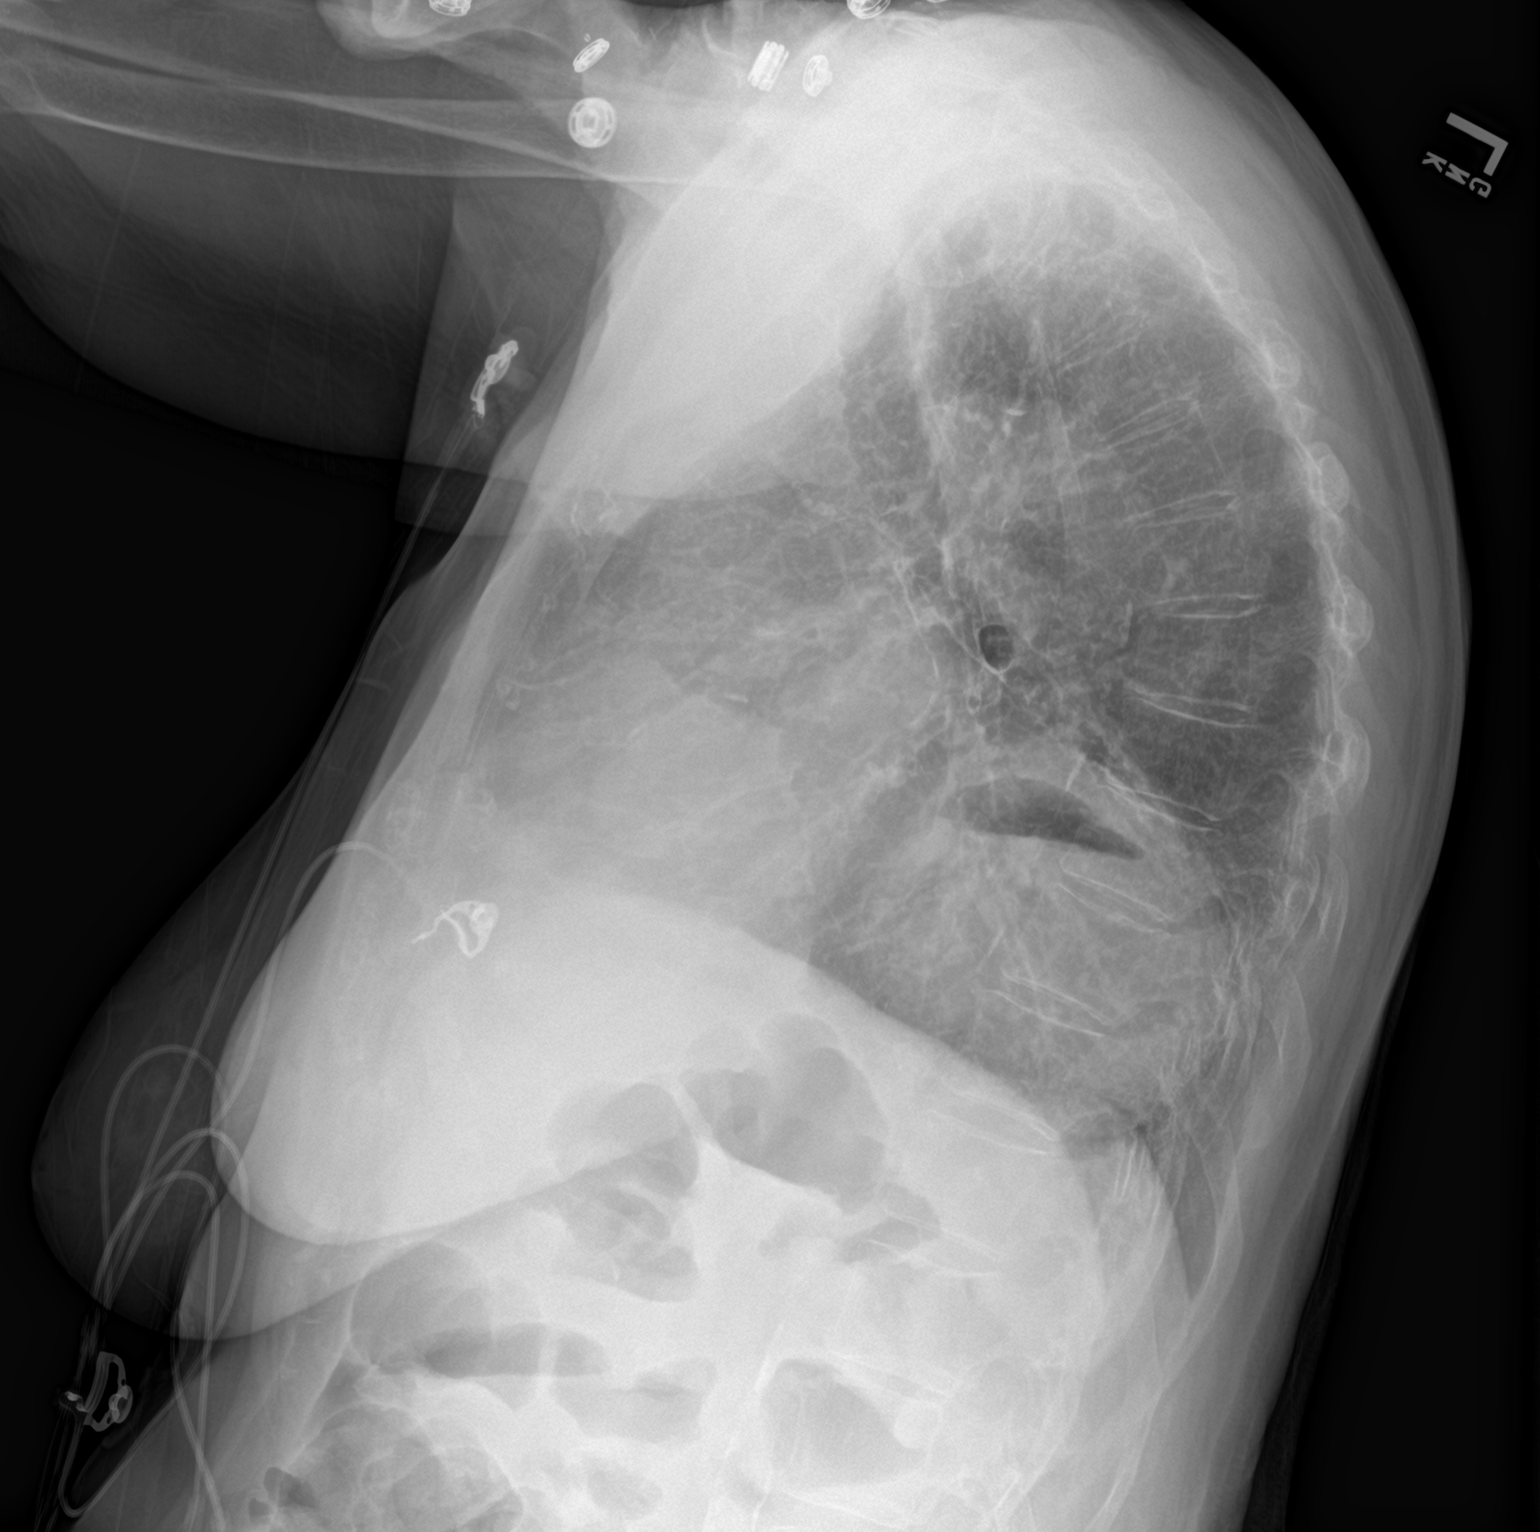

[2 of 2 positions shown; findings below may reference images not displayed]

FINDINGS: Large hiatal hernia. Heart is borderline in size. Improving
interstitial prominence throughout the lungs, likely improving
interstitial edema. Compressive atelectasis in the left base related
to the hiatal hernia. Right lung is clear. No effusions or acute
bony abnormality.
IMPRESSION: Interval clearance of previously seen interstitial edema.

## 2017-07-03 IMAGING — CR DG CHEST 2V
2 series · 2 of 2 positions shown · non-contrast
Comparison: PA and lateral chest 09/13/2015 and 05/08/2014.

CLINICAL DATA: Status post fall yesterday.  Chest pain.

EXAM:
CHEST  2 VIEW

[w chest lat]
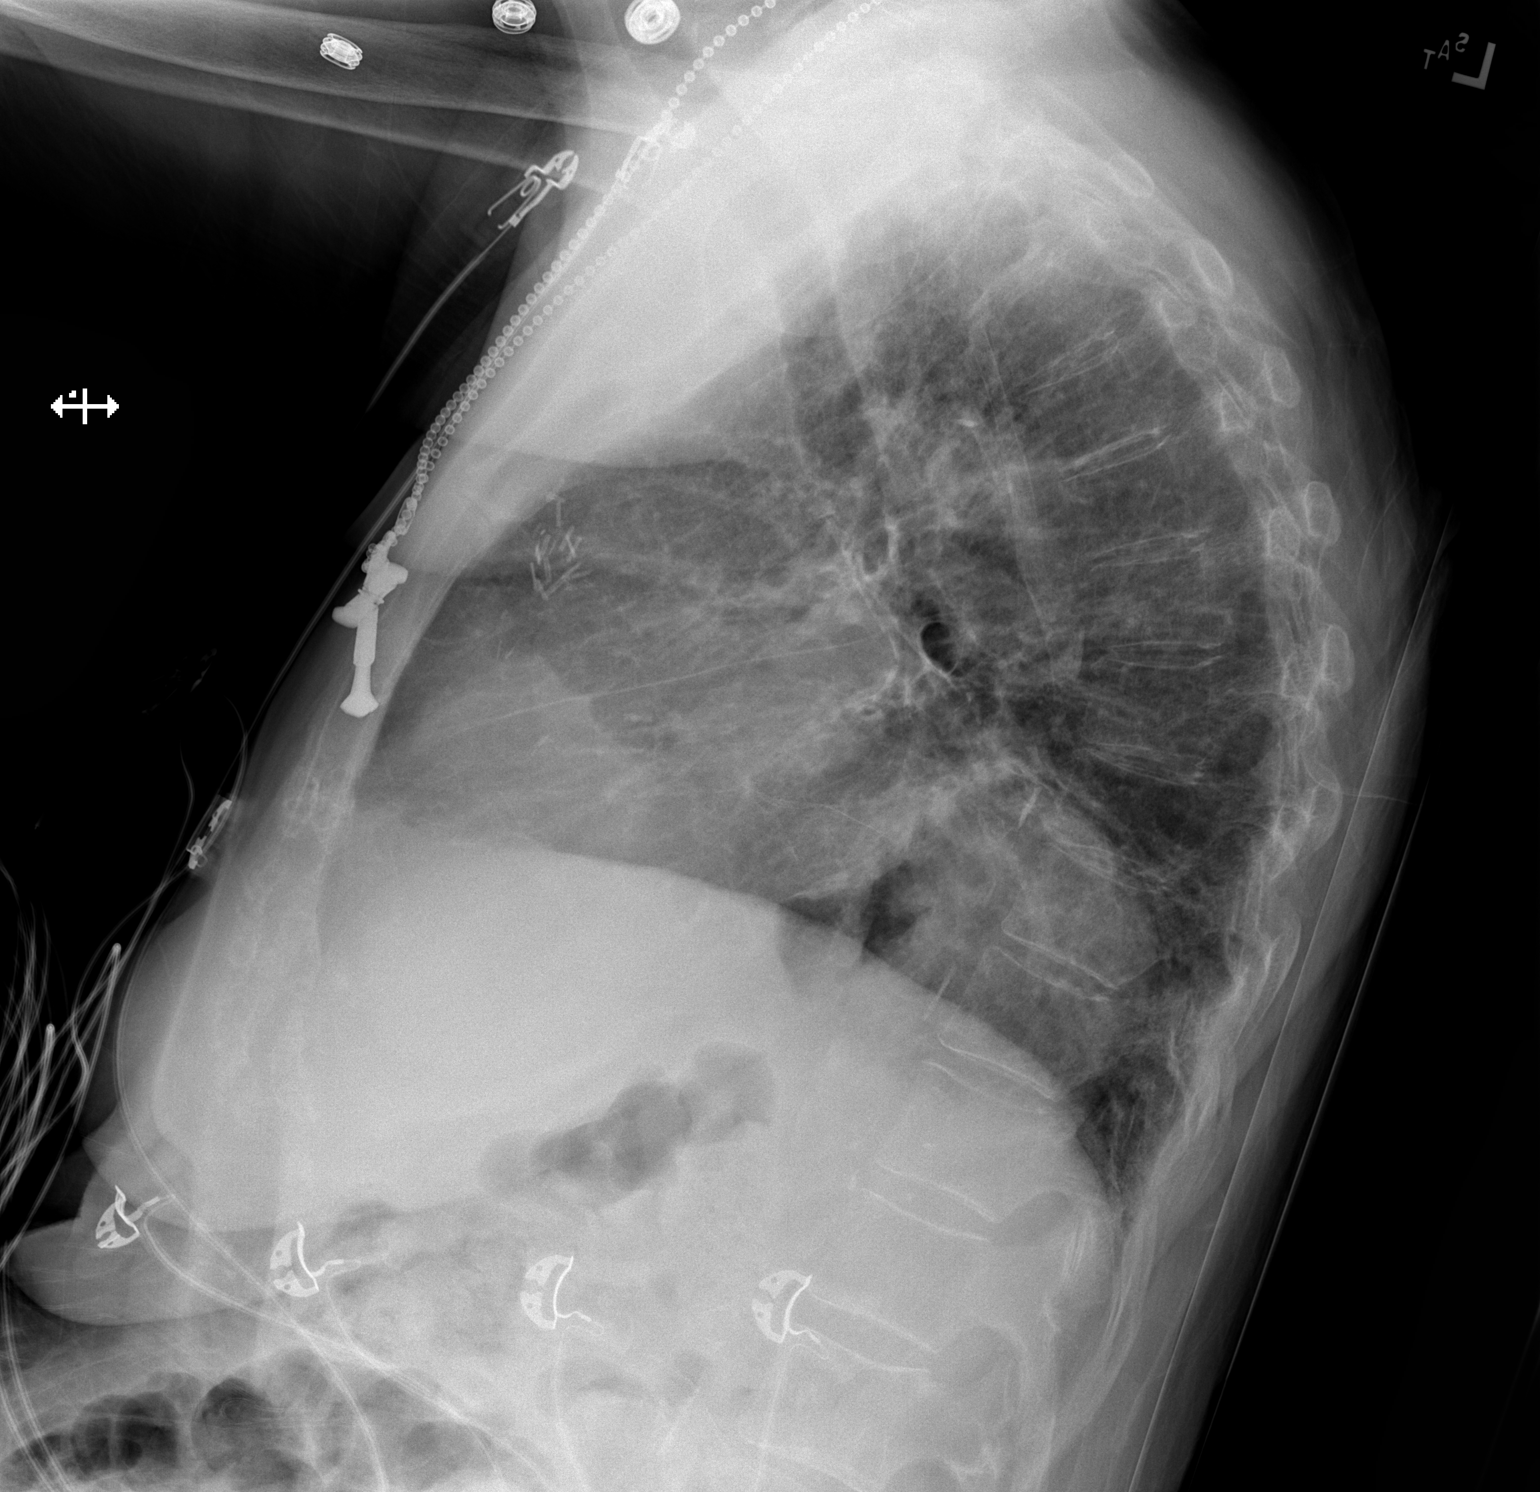

[x chest ap]
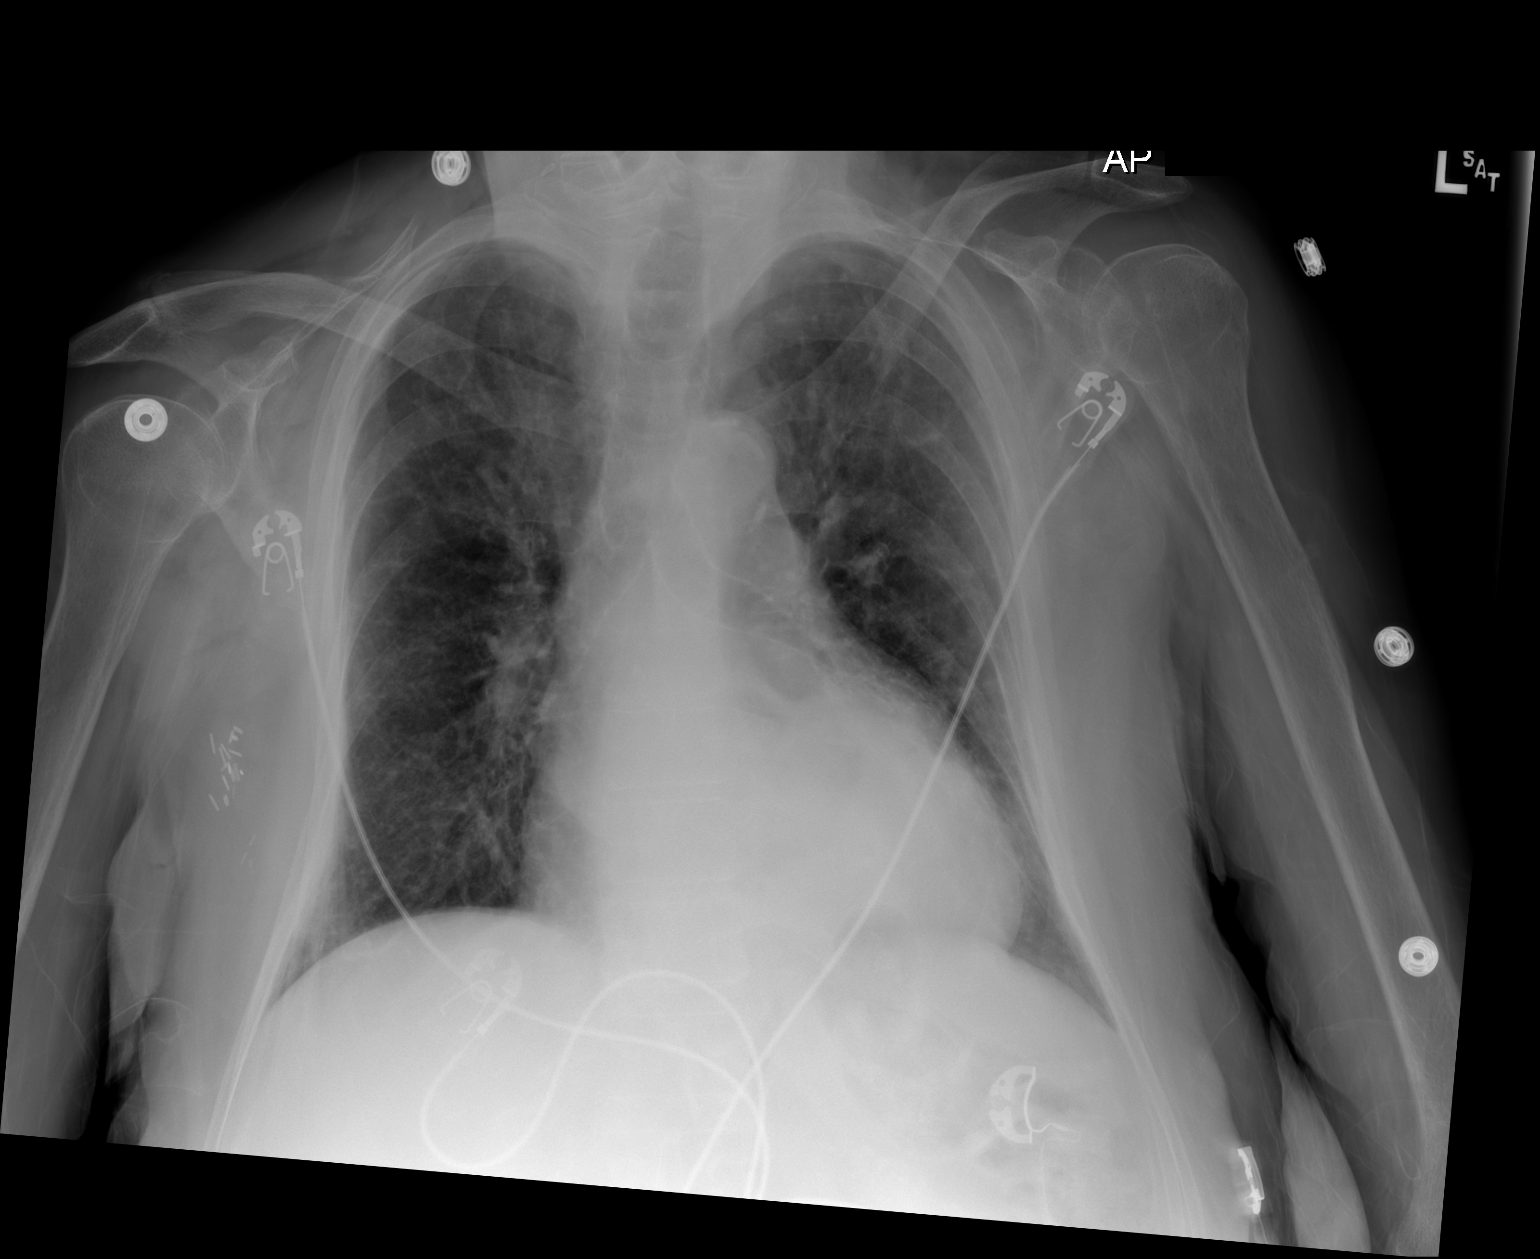

[2 of 2 positions shown; findings below may reference images not displayed]

FINDINGS: There is cardiomegaly and vascular congestion. No frank edema. No
consolidative process, pneumothorax or effusion. Aortic
atherosclerosis is noted. Large hiatal hernia is seen. No fracture
or other acute bony abnormality. Surgical clips right axilla are
noted.
IMPRESSION: No acute disease.

Cardiomegaly.

Atherosclerosis.

Large hiatal hernia.
